# Patient Record
Sex: Female | Born: 1960 | Race: Black or African American | Hispanic: No | Marital: Married | Smoking: Never smoker
Health system: Southern US, Community
[De-identification: ages and names within clinical notes are randomized; demographics above are authoritative.]

## PROBLEM LIST (undated history)

## (undated) DIAGNOSIS — F32A Depression, unspecified: Secondary | ICD-10-CM

## (undated) DIAGNOSIS — F329 Major depressive disorder, single episode, unspecified: Secondary | ICD-10-CM

## (undated) DIAGNOSIS — E663 Overweight: Secondary | ICD-10-CM

## (undated) DIAGNOSIS — E079 Disorder of thyroid, unspecified: Secondary | ICD-10-CM

## (undated) DIAGNOSIS — I10 Essential (primary) hypertension: Secondary | ICD-10-CM

## (undated) DIAGNOSIS — R232 Flushing: Secondary | ICD-10-CM

## (undated) HISTORY — DX: Flushing: R23.2

## (undated) HISTORY — DX: Overweight: E66.3

---

## 2008-01-15 ENCOUNTER — Emergency Department (HOSPITAL_BASED_OUTPATIENT_CLINIC_OR_DEPARTMENT_OTHER): Admission: EM | Admit: 2008-01-15 | Discharge: 2008-01-15 | Payer: Self-pay | Admitting: Emergency Medicine

## 2010-03-18 ENCOUNTER — Emergency Department (HOSPITAL_BASED_OUTPATIENT_CLINIC_OR_DEPARTMENT_OTHER)
Admission: EM | Admit: 2010-03-18 | Discharge: 2010-03-18 | Payer: Self-pay | Source: Home / Self Care | Admitting: Emergency Medicine

## 2010-03-18 LAB — URINE MICROSCOPIC-ADD ON

## 2010-03-18 LAB — CBC
Hemoglobin: 11.9 g/dL — ABNORMAL LOW (ref 12.0–15.0)
MCHC: 34.1 g/dL (ref 30.0–36.0)
Platelets: 256 10*3/uL (ref 150–400)
RDW: 16.5 % — ABNORMAL HIGH (ref 11.5–15.5)

## 2010-03-18 LAB — DIFFERENTIAL
Basophils Absolute: 0 10*3/uL (ref 0.0–0.1)
Eosinophils Absolute: 0 10*3/uL (ref 0.0–0.7)
Lymphs Abs: 0.3 10*3/uL — ABNORMAL LOW (ref 0.7–4.0)
Monocytes Absolute: 0.3 10*3/uL (ref 0.1–1.0)
Monocytes Relative: 6 % (ref 3–12)

## 2010-03-18 LAB — URINALYSIS, ROUTINE W REFLEX MICROSCOPIC
Bilirubin Urine: NEGATIVE
Nitrite: NEGATIVE
Protein, ur: 30 mg/dL — AB
Specific Gravity, Urine: 1.033 — ABNORMAL HIGH (ref 1.005–1.030)
Urobilinogen, UA: 0.2 mg/dL (ref 0.0–1.0)

## 2010-03-18 LAB — COMPREHENSIVE METABOLIC PANEL
ALT: 19 U/L (ref 0–35)
AST: 28 U/L (ref 0–37)
Albumin: 4.4 g/dL (ref 3.5–5.2)
Calcium: 9.3 mg/dL (ref 8.4–10.5)
GFR calc Af Amer: >60 mL/min (ref >60)
Sodium: 142 mEq/L (ref 135–145)
Total Protein: 8.5 g/dL — ABNORMAL HIGH (ref 6.0–8.3)

## 2011-06-08 ENCOUNTER — Emergency Department (HOSPITAL_BASED_OUTPATIENT_CLINIC_OR_DEPARTMENT_OTHER)
Admission: EM | Admit: 2011-06-08 | Discharge: 2011-06-08 | Disposition: A | Discharge status: Home / Self Care | Payer: Managed Care, Other (non HMO) | Attending: Emergency Medicine | Admitting: Emergency Medicine

## 2011-06-08 ENCOUNTER — Emergency Department (INDEPENDENT_AMBULATORY_CARE_PROVIDER_SITE_OTHER): Payer: Managed Care, Other (non HMO)

## 2011-06-08 ENCOUNTER — Encounter (HOSPITAL_BASED_OUTPATIENT_CLINIC_OR_DEPARTMENT_OTHER): Payer: Self-pay | Admitting: *Deleted

## 2011-06-08 DIAGNOSIS — R52 Pain, unspecified: Secondary | ICD-10-CM

## 2011-06-08 DIAGNOSIS — R509 Fever, unspecified: Secondary | ICD-10-CM

## 2011-06-08 DIAGNOSIS — R0602 Shortness of breath: Secondary | ICD-10-CM | POA: Insufficient documentation

## 2011-06-08 DIAGNOSIS — B349 Viral infection, unspecified: Secondary | ICD-10-CM

## 2011-06-08 DIAGNOSIS — R059 Cough, unspecified: Secondary | ICD-10-CM

## 2011-06-08 DIAGNOSIS — R05 Cough: Secondary | ICD-10-CM | POA: Insufficient documentation

## 2011-06-08 DIAGNOSIS — I10 Essential (primary) hypertension: Secondary | ICD-10-CM | POA: Insufficient documentation

## 2011-06-08 DIAGNOSIS — J029 Acute pharyngitis, unspecified: Secondary | ICD-10-CM | POA: Insufficient documentation

## 2011-06-08 HISTORY — DX: Essential (primary) hypertension: I10

## 2011-06-08 IMAGING — CR DG CHEST 2V
2 series · 2 of 2 positions shown · non-contrast
Comparison: None.

CLINICAL DATA: Fever and cough.

CHEST - 2 VIEW

[w chest pa]
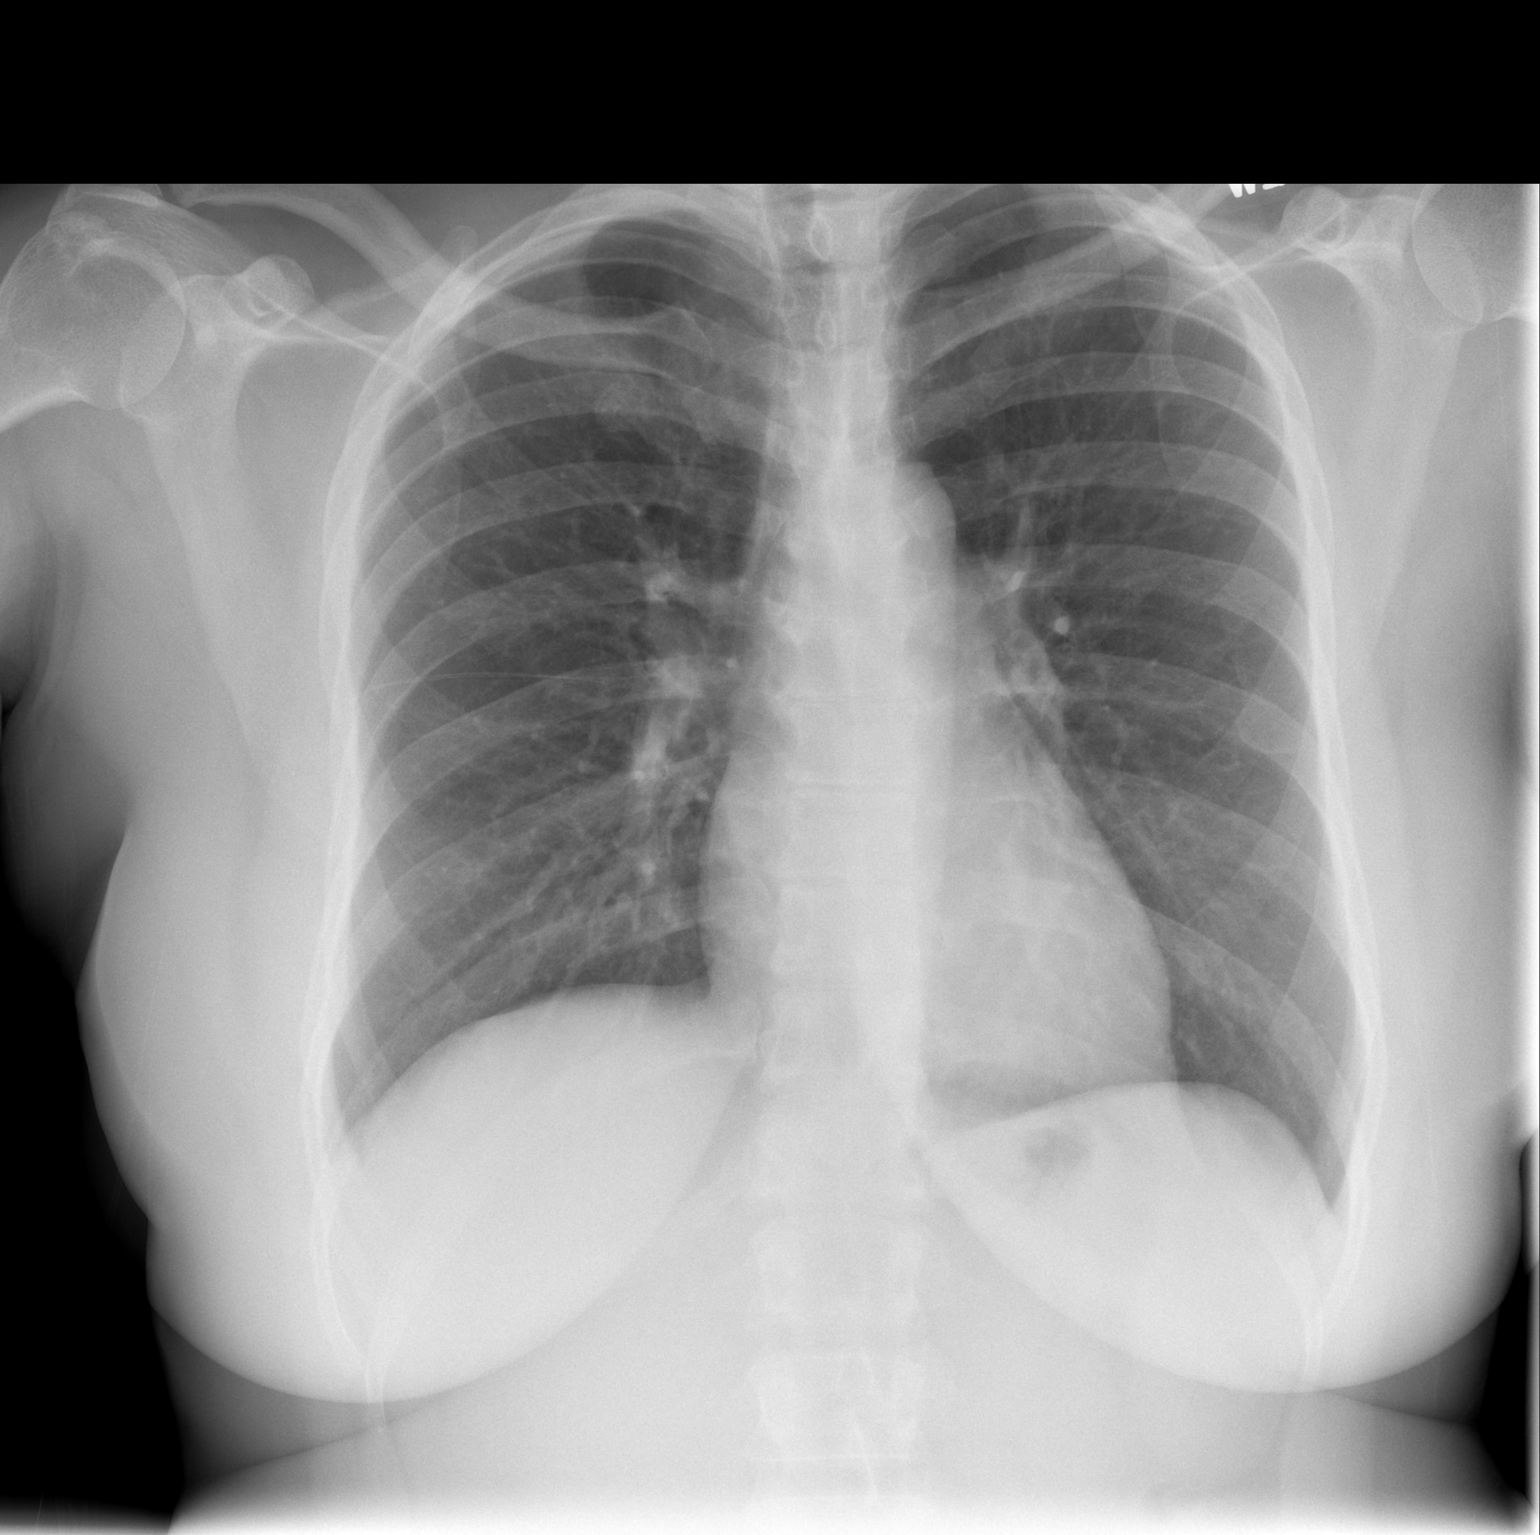

[w chest lat]
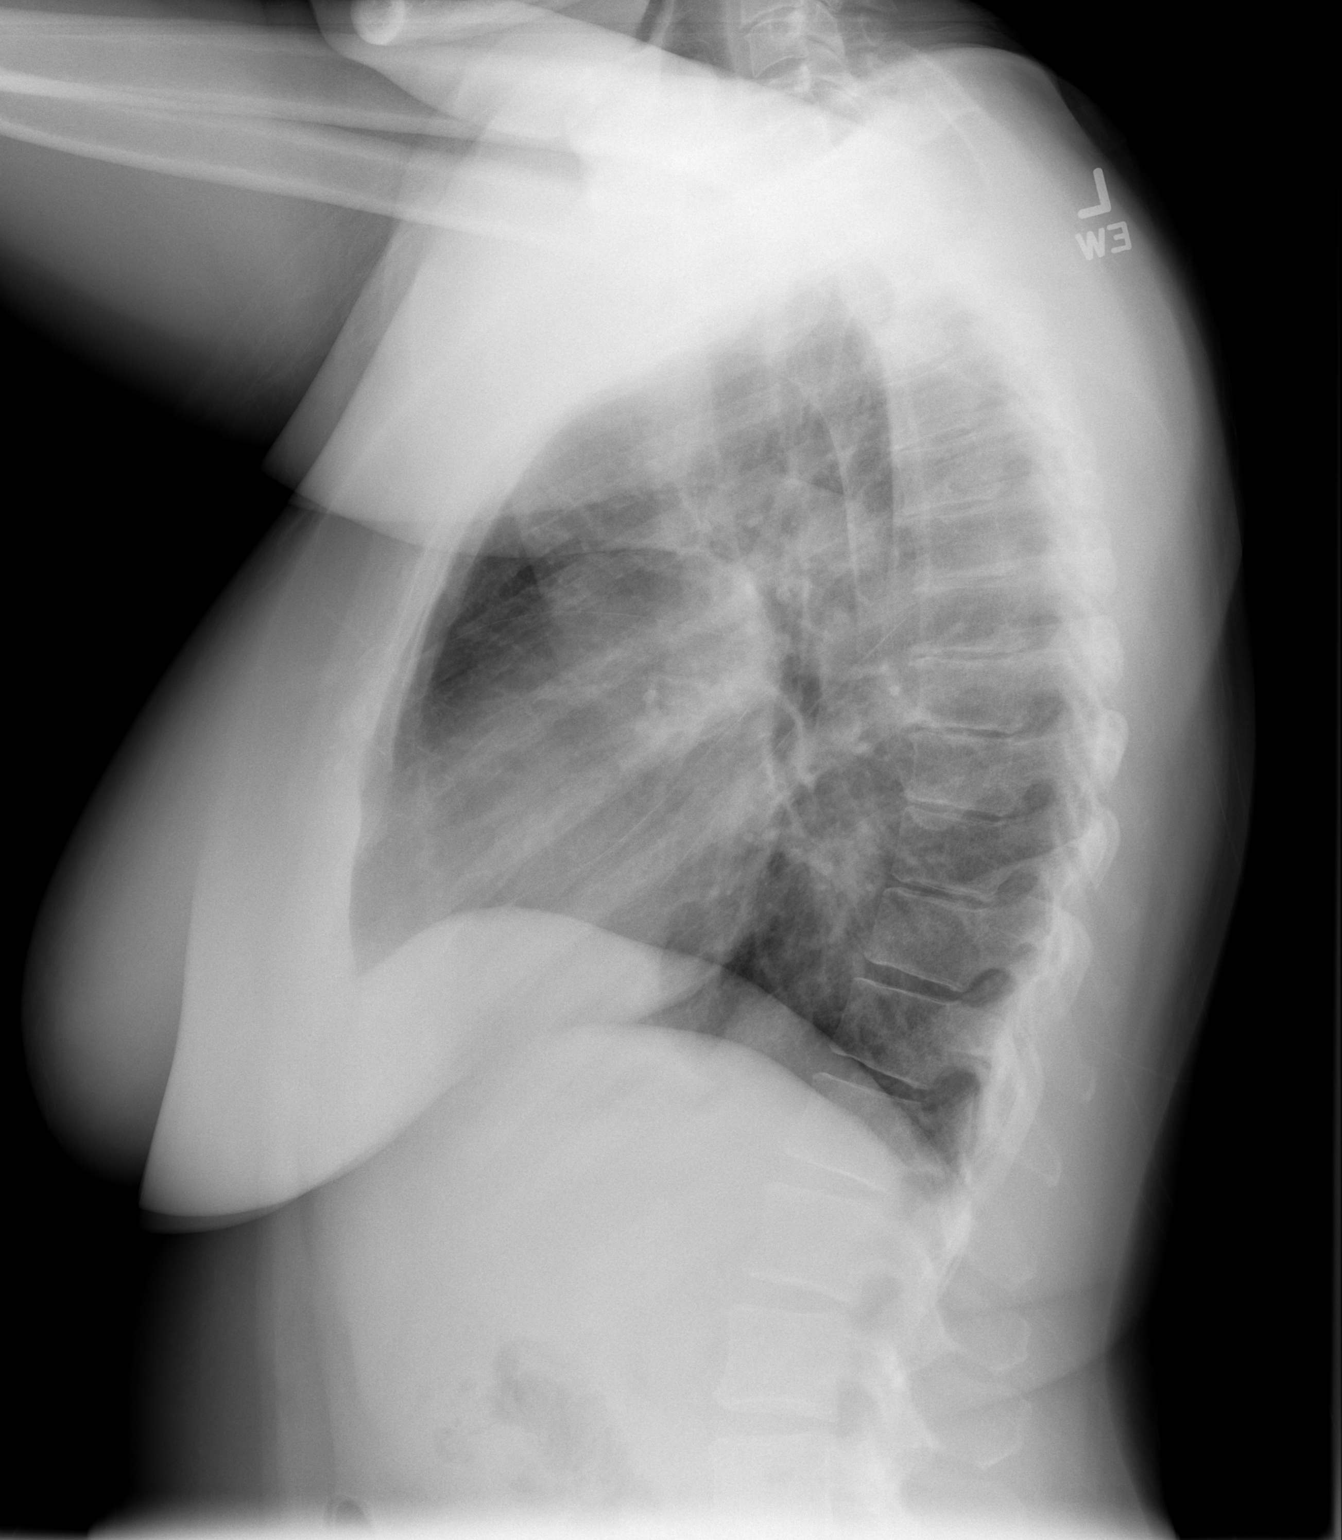

[2 of 2 positions shown; findings below may reference images not displayed]

FINDINGS: Trachea is midline.  Heart size normal.  Lungs are clear.
No pleural fluid.
IMPRESSION: No acute findings.

## 2011-06-08 MED ORDER — ALBUTEROL SULFATE (5 MG/ML) 0.5% IN NEBU
5.0000 mg | INHALATION_SOLUTION | Freq: Once | RESPIRATORY_TRACT | Status: AC
  Administered 2011-06-08: 5 mg via RESPIRATORY_TRACT
  Filled 2011-06-08: qty 1

## 2011-06-08 MED ORDER — IBUPROFEN 800 MG PO TABS
800.0000 mg | ORAL_TABLET | Freq: Once | ORAL | Status: AC
  Administered 2011-06-08: 800 mg via ORAL
  Filled 2011-06-08: qty 1

## 2011-06-08 MED ORDER — IPRATROPIUM BROMIDE 0.02 % IN SOLN
0.5000 mg | Freq: Once | RESPIRATORY_TRACT | Status: AC
  Administered 2011-06-08: 0.5 mg via RESPIRATORY_TRACT
  Filled 2011-06-08: qty 2.5

## 2011-06-08 NOTE — Discharge Instructions (Signed)
Antibiotic Nonuse  Your caregiver felt that the infection or problem was not one that would be helped with an antibiotic. Infections may be caused by viruses or bacteria. Only a caregiver can tell which one of these is the likely cause of an illness. A cold is the most common cause of infection in both adults and children. A cold is a virus. Antibiotic treatment will have no effect on a viral infection. Viruses can lead to many lost days of work caring for sick children and many missed days of school. Children may catch as many as 10 "colds" or "flus" per year during which they can be tearful, cranky, and uncomfortable. The goal of treating a virus is aimed at keeping the ill person comfortable. Antibiotics are medications used to help the body fight bacterial infections. There are relatively few types of bacteria that cause infections but there are hundreds of viruses. While both viruses and bacteria cause infection they are very different types of germs. A viral infection will typically go away by itself within 7 to 10 days. Bacterial infections may spread or get worse without antibiotic treatment. Examples of bacterial infections are:  Sore throats (like strep throat or tonsillitis).   Infection in the lung (pneumonia).   Ear and skin infections.  Examples of viral infections are:  Colds or flus.   Most coughs and bronchitis.   Sore throats not caused by Strep.   Runny noses.  It is often best not to take an antibiotic when a viral infection is the cause of the problem. Antibiotics can kill off the helpful bacteria that we have inside our body and allow harmful bacteria to start growing. Antibiotics can cause side effects such as allergies, nausea, and diarrhea without helping to improve the symptoms of the viral infection. Additionally, repeated uses of antibiotics can cause bacteria inside of our body to become resistant. That resistance can be passed onto harmful bacterial. The next time  you have an infection it may be harder to treat if antibiotics are used when they are not needed. Not treating with antibiotics allows our own immune system to develop and take care of infections more efficiently. Also, antibiotics will work better for us when they are prescribed for bacterial infections. Treatments for a child that is ill may include:  Give extra fluids throughout the day to stay hydrated.   Get plenty of rest.   Only give your child over-the-counter or prescription medicines for pain, discomfort, or fever as directed by your caregiver.   The use of a cool mist humidifier may help stuffy noses.   Cold medications if suggested by your caregiver.  Your caregiver may decide to start you on an antibiotic if:  The problem you were seen for today continues for a longer length of time than expected.   You develop a secondary bacterial infection.  SEEK MEDICAL CARE IF:  Fever lasts longer than 5 days.   Symptoms continue to get worse after 5 to 7 days or become severe.   Difficulty in breathing develops.   Signs of dehydration develop (poor drinking, rare urinating, dark colored urine).   Changes in behavior or worsening tiredness (listlessness or lethargy).  Document Released: 04/13/2001 Document Revised: 01/22/2011 Document Reviewed: 10/10/2008 Exit[employer] Patient Information 2012 [employer]. 

## 2011-06-08 NOTE — ED Provider Notes (Signed)
BP 134/72  Pulse 88  Temp(Src) 98.7 F (37.1 C) (Oral)  Resp 20  SpO2 100% I received patient in signout to f/u on CXR CXR reviewed and is negative Pt resting comfortably, no distress, lung sounds clear on my exam She reports cough/sore throat/myalgias Nontoxic in appearance, stable for d/c  Joya Gaskins, MD 06/08/11 [phone]

## 2011-06-08 NOTE — ED Notes (Signed)
Pt states that she began having a sore throat on Sat and last night while at work she developed generalized body aches and fever pt has had no meds PTA

## 2011-06-08 NOTE — ED Provider Notes (Signed)
History     CSN: [id number]  Arrival date & time 06/08/11  4098   First MD Initiated Contact with Patient 06/08/11 [phone]      Chief Complaint  Patient presents with  . Fever  . Generalized Body Aches    (Consider location/radiation/quality/duration/timing/severity/associated sxs/prior treatment) HPI This is a 51 year old black female who began having a sore throat 2 days ago. Yesterday she developed a fever to 103. She subsequently developed clear rhinorrhea, fever to 103, and generalized body aches, cough, wheezing and shortness of breath. She's not taking any medications for this. She describes the symptoms as moderate to severe. She has also had chills.  Past Medical History  Diagnosis Date  . Hypertension     Past Surgical History  Procedure Date  . Abdominal hysterectomy     History reviewed. No pertinent family history.  History  Substance Use Topics  . Smoking status: Never Smoker   . Smokeless tobacco: Not on file  . Alcohol Use: No    OB History    Grav Para Term Preterm Abortions TAB SAB Ect Mult Living                  Review of Systems  All other systems reviewed and are negative.    Allergies  Review of patient's allergies indicates no known allergies.  Home Medications   Current Outpatient Rx  Name Route Sig Dispense Refill  . BENICAR PO Oral Take by mouth.      BP 134/72  Pulse 88  Temp(Src) 98.7 F (37.1 C) (Oral)  Resp 20  SpO2 100%  Physical Exam General: Well-developed, well-nourished female in no acute distress; appearance consistent with age of record HENT: normocephalic, atraumatic; normal-appearing TMs; slight tonsillar exudate; nasal congestion Eyes: pupils equal round and reactive to light; extraocular muscles intact Neck: supple Heart: Regular rate and rhythm Lungs: Decreased air movement bilaterally without frank wheezing; dry cough Abdomen: soft; nondistended; nontender Extremities: No deformity; full range of  motion Neurologic: Awake, alert and oriented; motor function intact in all extremities and symmetric; no facial droop Skin: Warm and dry     ED Course  Procedures (including critical care time)    MDM  Disposition per Dr. Bebe Shaggy.         Hanley Seamen, MD 06/08/11 [phone]

## 2011-07-18 HISTORY — PX: ABDOMINAL HYSTERECTOMY: SHX81

## 2012-07-07 LAB — HM COLONOSCOPY

## 2014-02-16 HISTORY — PX: COLONOSCOPY: SHX174

## 2015-04-24 ENCOUNTER — Ambulatory Visit (INDEPENDENT_AMBULATORY_CARE_PROVIDER_SITE_OTHER): Payer: BLUE CROSS/BLUE SHIELD | Admitting: Internal Medicine

## 2015-04-24 ENCOUNTER — Encounter: Payer: Self-pay | Admitting: Internal Medicine

## 2015-04-24 VITALS — BP 130/100 | HR 65 | Temp 97.7°F | Resp 18 | Ht 65.0 in | Wt 195.6 lb

## 2015-04-24 DIAGNOSIS — I10 Essential (primary) hypertension: Secondary | ICD-10-CM

## 2015-04-24 DIAGNOSIS — N951 Menopausal and female climacteric states: Secondary | ICD-10-CM | POA: Diagnosis not present

## 2015-04-24 DIAGNOSIS — R1013 Epigastric pain: Secondary | ICD-10-CM | POA: Diagnosis not present

## 2015-04-24 DIAGNOSIS — G4700 Insomnia, unspecified: Secondary | ICD-10-CM

## 2015-04-24 DIAGNOSIS — R232 Flushing: Secondary | ICD-10-CM

## 2015-04-24 MED ORDER — PAROXETINE HCL 10 MG PO TABS: 10.0000 mg | ORAL_TABLET | Freq: Every day | ORAL | Status: DC

## 2015-04-24 MED ORDER — VALSARTAN-HYDROCHLOROTHIAZIDE 80-12.5 MG PO TABS: 1.0000 | ORAL_TABLET | Freq: Every day | ORAL | Status: DC

## 2015-04-24 NOTE — Progress Notes (Signed)
Patient ID: Anaissa Macfadden, female   DOB: [birth year], 55 y.o.   MRN: [id number]    Location:    [facility]   Place of Service:  OFFICE    Advanced Directive information Does patient have an advance directive?: Yes, Type of Advance Directive: Living will, Does patient want to make changes to advanced directive?: No - Patient declined  Chief Complaint  Patient presents with  . Establish Care    New Patient Establish Care  . Medical Management of Chronic Issues    HPI:  55 yo female seen today as a new pt. She is c/a hot flashes that began after brand benicar hct changed to generic. She has frequent night sweats. She is taking trazodone without relief. Sleep interrupted and gets no more than 2hrs a sleep per night. She has tremors. No relief with benadryl. She reports increased mood swings. She has hx hysterectomy but ovaries intact.  HTN - she stopped benicar hct. She rec'd generic form and believes this is what caused hot flashes. 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 She is taking benadryl  She had fasting labs drawn in Dec 2016 at [facility] - CBC and CMP showed no significant abnormalities  Past Medical History  Diagnosis Date  . Hypertension   . Hot flashes   . Overweight     Past Surgical History  Procedure Laterality Date  . Abdominal hysterectomy  07/18/2011    Dr.Mattern   . Colonoscopy  2016    Patient Reported     Patient Care Team: Gildardo Cranker, DO as PCP - General (Internal Medicine)  Social History   Social History  . Marital Status: Married    Spouse Name: N/A  . Number of Children: N/A  . Years of Education: N/A   Occupational History  . Not on file.   Social History Main Topics  . Smoking status: Former Smoker -- 4.00 packs/day for 2 years    Types: Cigarettes  . Smokeless tobacco: Never Used  . Alcohol Use: No  . Drug Use: No  . Sexual Activity: Not on file   Other Topics Concern   . Not on file   Social History Narrative   Diet: None   Caffeine: Yes   Married: Yes, 2001   House: 2 stories, 2 persons   Pets: No   Current/Past profession: None listed    Exercise: Yes   Living Will: Yes   DNR: N/A   POA/HPOA: N/A           reports that she has quit smoking. Her smoking use included Cigarettes. She has a 8 pack-year smoking history. She has never used smokeless tobacco. She reports that she does not drink alcohol or use illicit drugs.  Family History  Problem Relation Age of Onset  . High blood pressure Father   . Heart disease Mother   . Diabetes Sister   . Obesity Sister   . Diabetes Brother   . Arthritis Brother   . High blood pressure Sister   . High blood pressure Sister   . High blood pressure Brother    Family Status  Relation Status Death Age  . Father Alive   . Mother Deceased 3  . Sister Alive   . Sister Alive   . Brother Alive   . Sister Alive   . Sister Alive   . Brother Alive   . Daughter Alive   .  Daughter Alive      There is no immunization history on file for this patient.  No Known Allergies  Medications: Patient's Medications  New Prescriptions   No medications on file  Previous Medications   DIPHENHYDRAMINE (BENADRYL) 25 MG CAPSULE    Take 25 mg by mouth daily.   FAMOTIDINE (PEPCID) 20 MG TABLET    Take 20 mg by mouth daily. PRN   FLUTICASONE-SALMETEROL (ADVAIR DISKUS) 250-50 MCG/DOSE AEPB    Inhale 2 puffs into the lungs as needed.   HYDROCODONE-HOMATROPINE (HYCODAN) 5-1.5 MG/5ML SYRUP    Take 5 mLs by mouth every 6 (six) hours as needed for cough.   NAFTIFINE HCL 2 % CREA    Apply topically. Apply twice a day   OLMESARTAN-HYDROCHLOROTHIAZIDE (BENICAR HCT) 20-12.5 MG TABLET    Take 1 tablet by mouth daily.   TRAZODONE (DESYREL) 50 MG TABLET    Take 100 mg by mouth at bedtime.  Modified Medications   No medications on file  Discontinued Medications   No medications on file    Review of Systems    Constitutional: Positive for fatigue.  Respiratory: Positive for cough.   Endocrine:       Hot flashes  Neurological: Positive for tremors.  Psychiatric/Behavioral: Positive for sleep disturbance and dysphoric mood.  All other systems reviewed and are negative.   Filed Vitals:   04/24/15 0837  BP: 130/100  Pulse: 65  Temp: 97.7 F (36.5 C)  TempSrc: Oral  Resp: 18  Height: 5' 5"  (1.651 m)  Weight: 195 lb 9.6 oz (88.724 kg)  SpO2: 98%   Body mass index is 32.55 kg/(m^2).  Physical Exam  Constitutional: She is oriented to person, place, and time. She appears well-developed and well-nourished.  HENT:  Mouth/Throat: Oropharynx is clear and moist. No oropharyngeal exudate.  Eyes: Pupils are equal, round, and reactive to light. No scleral icterus.  Neck: Neck supple. Carotid bruit is not present. No tracheal deviation present. No thyromegaly present.  Cardiovascular: Normal rate, regular rhythm, normal heart sounds and intact distal pulses.  Exam reveals no gallop and no friction rub.   No murmur heard. No LE edema b/l. no calf TTP.   Pulmonary/Chest: Effort normal and breath sounds normal. No stridor. No respiratory distress. She has no wheezes. She has no rales.  Abdominal: Soft. Bowel sounds are normal. She exhibits no distension and no mass. There is no hepatomegaly. There is no tenderness. There is no rebound and no guarding.  Lymphadenopathy:    She has no cervical adenopathy.  Neurological: She is alert and oriented to person, place, and time. She displays tremor (resting).  Skin: Skin is warm and dry. No rash noted.  Psychiatric: Judgment and thought content normal. She is aggressive. She exhibits a depressed mood.     Labs reviewed: No visits with results within 3 Month(s) from this visit. Latest known visit with results is:  Hospital Outpatient Visit on 03/18/2010  Component Date Value Ref Range Status  . Neutrophils Relative % 03/18/2010 88* 43 - 77 % Final  .  Lymphocytes Relative 03/18/2010 6* 12 - 46 % Final  . Monocytes Relative 03/18/2010 6  3 - 12 % Final  . Eosinophils Relative 03/18/2010 0  0 - 5 % Final  . Basophils Relative 03/18/2010 0  0 - 1 % Final  . Neutro Abs 03/18/2010 4.9  1.7 - 7.7 K/uL Final  . Lymphs Abs 03/18/2010 0.3* 0.7 - 4.0 K/uL Final  . Monocytes Absolute 03/18/2010  0.3  0.1 - 1.0 K/uL Final  . Eosinophils Absolute 03/18/2010 0.0  0.0 - 0.7 K/uL Final  . Basophils Absolute 03/18/2010 0.0  0.0 - 0.1 K/uL Final  . Smear Review 03/18/2010 MORPHOLOGY UNREMARKABLE   Final  . WBC 03/18/2010 5.5  4.0 - 10.5 K/uL Final  . RBC 03/18/2010 5.08  3.87 - 5.11 MIL/uL Final  . Hemoglobin 03/18/2010 11.9* 12.0 - 15.0 g/dL Final  . HCT 03/18/2010 34.9* 36.0 - 46.0 % Final  . MCV 03/18/2010 68.7* 78.0 - 100.0 fL Final  . MCH 03/18/2010 23.4* 26.0 - 34.0 pg Final  . MCHC 03/18/2010 34.1  30.0 - 36.0 g/dL Final  . RDW 03/18/2010 16.5* 11.5 - 15.5 % Final  . Platelets 03/18/2010 256  150 - 400 K/uL Final  . Sodium 03/18/2010 142  135 - 145 mEq/L Final  . Potassium 03/18/2010 4.4  3.5 - 5.1 mEq/L Final  . Chloride 03/18/2010 106  96 - 112 mEq/L Final  . CO2 03/18/2010 23  19 - 32 mEq/L Final  . Glucose, Bld 03/18/2010 144* 70 - 99 mg/dL Final  . BUN 03/18/2010 16  6 - 23 mg/dL Final  . Creatinine, Ser 03/18/2010 .8  0.4 - 1.2 mg/dL Final  . Calcium 03/18/2010 9.3  8.4 - 10.5 mg/dL Final  . Total Protein 03/18/2010 8.5* 6.0 - 8.3 g/dL Final  . Albumin 03/18/2010 4.4  3.5 - 5.2 g/dL Final  . AST 03/18/2010 28  0 - 37 U/L Final  . ALT 03/18/2010 19  0 - 35 U/L Final  . Alkaline Phosphatase 03/18/2010 89  39 - 117 U/L Final  . Total Bilirubin 03/18/2010 0.8  0.3 - 1.2 mg/dL Final  . GFR calc non Af Amer 03/18/2010 >60  >60 mL/min Final  . GFR calc Af Amer 03/18/2010   >60 mL/min Final                   Value:>60                                The eGFR has been calculated                         using the MDRD equation.                          This calculation has not been                         validated in all clinical                         situations.                         eGFR's persistently                         <60 mL/min signify                         possible Chronic Kidney Disease.  Marland Kitchen Squamous Epithelial / LPF 03/18/2010 RARE  RARE Final  . WBC, UA 03/18/2010 0-2  <3 WBC/hpf Final  . RBC / HPF 03/18/2010 0-2  <3 RBC/hpf Final  . Bacteria, UA  03/18/2010 FEW* RARE Final  . Urine-Other 03/18/2010 MUCOUS PRESENT   Final  . Color, Urine 03/18/2010 YELLOW  YELLOW Final  . APPearance 03/18/2010 CLEAR  CLEAR Final  . Specific Gravity, Urine 03/18/2010 1.033* 1.005 - 1.030 Final  . pH 03/18/2010 5.5  5.0 - 8.0 Final  . Urine Glucose, Fasting 03/18/2010 NEGATIVE  NEGATIVE mg/dL Final  . Hgb urine dipstick 03/18/2010 TRACE* NEGATIVE Final  . Bilirubin Urine 03/18/2010 NEGATIVE  NEGATIVE Final  . Ketones, ur 03/18/2010 NEGATIVE  NEGATIVE mg/dL Final  . Protein, ur 03/18/2010 30* NEGATIVE mg/dL Final  . Urobilinogen, UA 03/18/2010 0.2  0.0 - 1.0 mg/dL Final  . Nitrite 03/18/2010 NEGATIVE  NEGATIVE Final  . Leukocytes, UA 03/18/2010 NEGATIVE  NEGATIVE Final    No results found.   Assessment/Plan   ICD-9-CM ICD-10-CM   1. Essential hypertension - uncontrolled off med 401.9 I10 valsartan-hydrochlorothiazide (DIOVAN HCT) 80-12.5 MG tablet  2. Hot flashes 627.2 N95.1 PARoxetine (PAXIL) 10 MG tablet  3. Insomnia - due to #2 780.52 G47.00 PARoxetine (PAXIL) 10 MG tablet  4. Dyspepsia - stable 536.8 R10.13     Start diovan hct- take 1 daily  Start paxil for vasomotor sx's- may take at night  Get old records  Follow up in 1 month for hotflashes and blood pressure  Heavan Francom S. Perlie Gold  [facility] [address] [place], [place] [address] [phone] Cell (Monday-Friday 8 AM - 5 PM) [phone] After 5 PM and follow prompts

## 2015-04-24 NOTE — Patient Instructions (Signed)
Start blood pressure medication - take 1 daily  Start hot flash medication - may take at night  Get old records  Follow up in 1 month for hotflashes and blood preesue

## 2015-05-22 ENCOUNTER — Ambulatory Visit (INDEPENDENT_AMBULATORY_CARE_PROVIDER_SITE_OTHER): Payer: BLUE CROSS/BLUE SHIELD | Admitting: Internal Medicine

## 2015-05-22 ENCOUNTER — Encounter: Payer: Self-pay | Admitting: Internal Medicine

## 2015-05-22 VITALS — BP 120/84 | HR 73 | Temp 98.0°F | Resp 18 | Ht 65.0 in | Wt 197.8 lb

## 2015-05-22 DIAGNOSIS — N951 Menopausal and female climacteric states: Secondary | ICD-10-CM | POA: Diagnosis not present

## 2015-05-22 DIAGNOSIS — I10 Essential (primary) hypertension: Secondary | ICD-10-CM

## 2015-05-22 DIAGNOSIS — E669 Obesity, unspecified: Secondary | ICD-10-CM

## 2015-05-22 DIAGNOSIS — R232 Flushing: Secondary | ICD-10-CM

## 2015-05-22 DIAGNOSIS — G4700 Insomnia, unspecified: Secondary | ICD-10-CM | POA: Diagnosis not present

## 2015-05-22 MED ORDER — PHENTERMINE HCL 30 MG PO CAPS: 30.0000 mg | ORAL_CAPSULE | ORAL | Status: DC

## 2015-05-22 NOTE — Patient Instructions (Addendum)
Resume trazodone. Take 1 tablet at bedtime. If no response, may take 2 tablets.  Continue all other medications as ordered  Follow up in 6 weeks for routine visit  Calorie Counting for Weight Loss Calories are energy you get from the things you eat and drink. Your body uses this energy to keep you going throughout the day. The number of calories you eat affects your weight. When you eat more calories than your body needs, your body stores the extra calories as fat. When you eat fewer calories than your body needs, your body burns fat to get the energy it needs. Calorie counting means keeping track of how many calories you eat and drink each day. If you make sure to eat fewer calories than your body needs, you should lose weight. In order for calorie counting to work, you will need to eat the number of calories that are right for you in a day to lose a healthy amount of weight per week. A healthy amount of weight to lose per week is usually 1-2 lb (0.5-0.9 kg). A dietitian can determine how many calories you need in a day and give you suggestions on how to reach your calorie goal.  WHAT IS MY MY PLAN? My goal is to have 1200 calories per day.  If I have this many calories per day, I should lose around 2 pounds per week. WHAT DO I NEED TO KNOW ABOUT CALORIE COUNTING? In order to meet your daily calorie goal, you will need to:  Find out how many calories are in each food you would like to eat. Try to do this before you eat.  Decide how much of the food you can eat.  Write down what you ate and how many calories it had. Doing this is called keeping a food log. WHERE DO I FIND CALORIE INFORMATION? The number of calories in a food can be found on a Nutrition Facts label. Note that all the information on a label is based on a specific serving of the food. If a food does not have a Nutrition Facts label, try to look up the calories online or ask your dietitian for help. HOW DO I DECIDE HOW MUCH TO  EAT? To decide how much of the food you can eat, you will need to consider both the number of calories in one serving and the size of one serving. This information can be found on the Nutrition Facts label. If a food does not have a Nutrition Facts label, look up the information online or ask your dietitian for help. Remember that calories are listed per serving. If you choose to have more than one serving of a food, you will have to multiply the calories per serving by the amount of servings you plan to eat. For example, the label on a package of bread might say that a serving size is 1 slice and that there are 90 calories in a serving. If you eat 1 slice, you will have eaten 90 calories. If you eat 2 slices, you will have eaten 180 calories. HOW DO I KEEP A FOOD LOG? After each meal, record the following information in your food log:  What you ate.  How much of it you ate.  How many calories it had.  Then, add up your calories. Keep your food log near you, such as in a small notebook in your pocket. Another option is to use a mobile app or website. Some programs will calculate calories for  you and show you how many calories you have left each time you add an item to the log. WHAT ARE SOME CALORIE COUNTING TIPS?  Use your calories on foods and drinks that will fill you up and not leave you hungry. Some examples of this include foods like nuts and nut butters, vegetables, lean proteins, and high-fiber foods (more than 5 g fiber per serving).  Eat nutritious foods and avoid empty calories. Empty calories are calories you get from foods or beverages that do not have many nutrients, such as candy and soda. It is better to have a nutritious high-calorie food (such as an avocado) than a food with few nutrients (such as a bag of chips).  Know how many calories are in the foods you eat most often. This way, you do not have to look up how many calories they have each time you eat them.  Look out for  foods that may seem like low-calorie foods but are really high-calorie foods, such as baked goods, soda, and fat-free candy.  Pay attention to calories in drinks. Drinks such as sodas, specialty coffee drinks, alcohol, and juices have a lot of calories yet do not fill you up. Choose low-calorie drinks like water and diet drinks.  Focus your calorie counting efforts on higher calorie items. Logging the calories in a garden salad that contains only vegetables is less important than calculating the calories in a milk shake.  Find a way of tracking calories that works for you. Get creative. Most people who are successful find ways to keep track of how much they eat in a day, even if they do not count every calorie. WHAT ARE SOME PORTION CONTROL TIPS?  Know how many calories are in a serving. This will help you know how many servings of a certain food you can have.  Use a measuring cup to measure serving sizes. This is helpful when you start out. With time, you will be able to estimate serving sizes for some foods.  Take some time to put servings of different foods on your favorite plates, bowls, and cups so you know what a serving looks like.  Try not to eat straight from a bag or box. Doing this can lead to overeating. Put the amount you would like to eat in a cup or on a plate to make sure you are eating the right portion.  Use smaller plates, glasses, and bowls to prevent overeating. This is a quick and easy way to practice portion control. If your plate is smaller, less food can fit on it.  Try not to multitask while eating, such as watching TV or using your computer. If it is time to eat, sit down at a table and enjoy your food. Doing this will help you to start recognizing when you are full. It will also make you more aware of what and how much you are eating. HOW CAN I CALORIE COUNT WHEN EATING OUT?  Ask for smaller portion sizes or child-sized portions.  Consider sharing an entree and  sides instead of getting your own entree.  If you get your own entree, eat only half. Ask for a box at the beginning of your meal and put the rest of your entree in it so you are not tempted to eat it.  Look for the calories on the menu. If calories are listed, choose the lower calorie options.  Choose dishes that include vegetables, fruits, whole grains, low-fat dairy products, and lean protein. Focusing  on smart food choices from each of the 5 food groups can help you stay on track at restaurants.  Choose items that are boiled, broiled, grilled, or steamed.  Choose water, milk, unsweetened iced tea, or other drinks without added sugars. If you want an alcoholic beverage, choose a lower calorie option. For example, a regular margarita can have up to 700 calories and a glass of wine has around 150.  Stay away from items that are buttered, battered, fried, or served with cream sauce. Items labeled "crispy" are usually fried, unless stated otherwise.  Ask for dressings, sauces, and syrups on the side. These are usually very high in calories, so do not eat much of them.  Watch out for salads. Many people think salads are a healthy option, but this is often not the case. Many salads come with bacon, fried chicken, lots of cheese, fried chips, and dressing. All of these items have a lot of calories. If you want a salad, choose a garden salad and ask for grilled meats or steak. Ask for the dressing on the side, or ask for olive oil and vinegar or lemon to use as dressing.  Estimate how many servings of a food you are given. For example, a serving of cooked rice is  cup or about the size of half a tennis ball or one cupcake wrapper. Knowing serving sizes will help you be aware of how much food you are eating at restaurants. The list below tells you how big or small some common portion sizes are based on everyday objects.  1 oz--4 stacked dice.  3 oz--1 deck of cards.  1 tsp--1 dice.  1 Tbsp-- a  Ping-Pong ball.  2 Tbsp--1 Ping-Pong ball.   cup--1 tennis ball or 1 cupcake wrapper.  1 cup--1 baseball.   This information is not intended to replace advice given to you by your health care provider. Make sure you discuss any questions you have with your health care provider.   Document Released: 02/02/2005 Document Revised: 02/23/2014 Document Reviewed: 12/08/2012 Elsevier Interactive Patient Education [employer].

## 2015-05-22 NOTE — Progress Notes (Signed)
Patient ID: Angela Frost, female   DOB: [birth year], 55 y.o.   MRN: [id number]    Location:    [facility]   Place of Service:  OFFICE   Chief Complaint  Patient presents with  . Medical Management of Chronic Issues    1 month follow-up for routine visit    HPI:  55 yo female seen today for f/u. She feels better overall but is still not sleeping through the night. No issues with falling asleep but has difficulty staying asleep. She has not been taking trazodone as she was unaware that she could take it in combo with paroxetine  HTN - BP improved on diovan hct. Some urinary frequency and has nocturia (2x per night)  Vasomotor sx's/insomnia - hot flashes and night sweats improved on paxil.   hyperlipidemia - pt prefers to watch diet. Total cholesterol 236, HDL 59 and LDL 165 in July 2016  Past Medical History  Diagnosis Date  . Hypertension   . Hot flashes   . Overweight     Past Surgical History  Procedure Laterality Date  . Abdominal hysterectomy  07/18/2011    Dr.Mattern   . Colonoscopy  2016    Patient Reported     Patient Care Team: Gildardo Cranker, DO as PCP - General (Internal Medicine)  Social History   Social History  . Marital Status: Married    Spouse Name: N/A  . Number of Children: N/A  . Years of Education: N/A   Occupational History  . Not on file.   Social History Main Topics  . Smoking status: Former Smoker -- 4.00 packs/day for 2 years    Types: Cigarettes  . Smokeless tobacco: Never Used  . Alcohol Use: No  . Drug Use: No  . Sexual Activity: Not on file   Other Topics Concern  . Not on file   Social History Narrative   Diet: None   Caffeine: Yes   Married: Yes, 2001   House: 2 stories, 2 persons   Pets: No   Current/Past profession: None listed    Exercise: Yes   Living Will: Yes   DNR: N/A   POA/HPOA: N/A           reports that she has quit smoking. Her smoking use included Cigarettes. She has a 8 pack-year smoking history. She has  never used smokeless tobacco. She reports that she does not drink alcohol or use illicit drugs.  Allergies  Allergen Reactions  . Estradiol Itching    Medications: Patient's Medications  New Prescriptions   No medications on file  Previous Medications   DIPHENHYDRAMINE (BENADRYL) 25 MG CAPSULE    Take 25 mg by mouth daily.   FAMOTIDINE (PEPCID) 20 MG TABLET    Take 20 mg by mouth daily. PRN   FLUTICASONE-SALMETEROL (ADVAIR DISKUS) 250-50 MCG/DOSE AEPB    Inhale 2 puffs into the lungs as needed.   NAFTIFINE HCL 2 % CREA    Apply topically. Apply twice a day   PAROXETINE (PAXIL) 10 MG TABLET    Take 1 tablet (10 mg total) by mouth daily.   TRAZODONE (DESYREL) 50 MG TABLET    Take 100 mg by mouth at bedtime.   VALSARTAN-HYDROCHLOROTHIAZIDE (DIOVAN HCT) 80-12.5 MG TABLET    Take 1 tablet by mouth daily.  Modified Medications   No medications on file  Discontinued Medications   HYDROCODONE-HOMATROPINE (HYCODAN) 5-1.5 MG/5ML SYRUP    Take 5 mLs by mouth every 6 (six) hours as needed for  cough.   OLMESARTAN-HYDROCHLOROTHIAZIDE (BENICAR HCT) 20-12.5 MG TABLET    Take 1 tablet by mouth daily.    Review of Systems  Genitourinary: Positive for frequency.  Psychiatric/Behavioral: Positive for sleep disturbance.  All other systems reviewed and are negative.   Filed Vitals:   05/22/15 1433  BP: 120/84  Pulse: 73  Temp: 98 F (36.7 C)  TempSrc: Oral  Resp: 18  Height: 5' 5"  (1.651 m)  Weight: 197 lb 12.8 oz (89.721 kg)  SpO2: 98%   Body mass index is 32.92 kg/(m^2).  Physical Exam  Constitutional: She is oriented to person, place, and time. She appears well-developed and well-nourished.  Cardiovascular: Normal rate, regular rhythm, normal heart sounds and intact distal pulses.  Exam reveals no gallop and no friction rub.   No murmur heard. Pulmonary/Chest: Effort normal and breath sounds normal. No respiratory distress. She has no wheezes. She has no rales. She exhibits no  tenderness.  Neurological: She is alert and oriented to person, place, and time.  Skin: Skin is warm and dry. No rash noted.  Psychiatric: She has a normal mood and affect. Her behavior is normal. Judgment and thought content normal.     Labs reviewed: No visits with results within 3 Month(s) from this visit. Latest known visit with results is:  Hospital Outpatient Visit on 03/18/2010  Component Date Value Ref Range Status  . Neutrophils Relative % 03/18/2010 88* 43 - 77 % Final  . Lymphocytes Relative 03/18/2010 6* 12 - 46 % Final  . Monocytes Relative 03/18/2010 6  3 - 12 % Final  . Eosinophils Relative 03/18/2010 0  0 - 5 % Final  . Basophils Relative 03/18/2010 0  0 - 1 % Final  . Neutro Abs 03/18/2010 4.9  1.7 - 7.7 K/uL Final  . Lymphs Abs 03/18/2010 0.3* 0.7 - 4.0 K/uL Final  . Monocytes Absolute 03/18/2010 0.3  0.1 - 1.0 K/uL Final  . Eosinophils Absolute 03/18/2010 0.0  0.0 - 0.7 K/uL Final  . Basophils Absolute 03/18/2010 0.0  0.0 - 0.1 K/uL Final  . Smear Review 03/18/2010 MORPHOLOGY UNREMARKABLE   Final  . WBC 03/18/2010 5.5  4.0 - 10.5 K/uL Final  . RBC 03/18/2010 5.08  3.87 - 5.11 MIL/uL Final  . Hemoglobin 03/18/2010 11.9* 12.0 - 15.0 g/dL Final  . HCT 03/18/2010 34.9* 36.0 - 46.0 % Final  . MCV 03/18/2010 68.7* 78.0 - 100.0 fL Final  . MCH 03/18/2010 23.4* 26.0 - 34.0 pg Final  . MCHC 03/18/2010 34.1  30.0 - 36.0 g/dL Final  . RDW 03/18/2010 16.5* 11.5 - 15.5 % Final  . Platelets 03/18/2010 256  150 - 400 K/uL Final  . Sodium 03/18/2010 142  135 - 145 mEq/L Final  . Potassium 03/18/2010 4.4  3.5 - 5.1 mEq/L Final  . Chloride 03/18/2010 106  96 - 112 mEq/L Final  . CO2 03/18/2010 23  19 - 32 mEq/L Final  . Glucose, Bld 03/18/2010 144* 70 - 99 mg/dL Final  . BUN 03/18/2010 16  6 - 23 mg/dL Final  . Creatinine, Ser 03/18/2010 .8  0.4 - 1.2 mg/dL Final  . Calcium 03/18/2010 9.3  8.4 - 10.5 mg/dL Final  . Total Protein 03/18/2010 8.5* 6.0 - 8.3 g/dL Final  .  Albumin 03/18/2010 4.4  3.5 - 5.2 g/dL Final  . AST 03/18/2010 28  0 - 37 U/L Final  . ALT 03/18/2010 19  0 - 35 U/L Final  . Alkaline Phosphatase 03/18/2010 89  39 - 117 U/L Final  . Total Bilirubin 03/18/2010 0.8  0.3 - 1.2 mg/dL Final  . GFR calc non Af Amer 03/18/2010 >60  >60 mL/min Final  . GFR calc Af Amer 03/18/2010   >60 mL/min Final                   Value:>60                                The eGFR has been calculated                         using the MDRD equation.                         This calculation has not been                         validated in all clinical                         situations.                         eGFR's persistently                         <60 mL/min signify                         possible Chronic Kidney Disease.  Marland Kitchen Squamous Epithelial / LPF 03/18/2010 RARE  RARE Final  . WBC, UA 03/18/2010 0-2  <3 WBC/hpf Final  . RBC / HPF 03/18/2010 0-2  <3 RBC/hpf Final  . Bacteria, UA 03/18/2010 FEW* RARE Final  . Urine-Other 03/18/2010 MUCOUS PRESENT   Final  . Color, Urine 03/18/2010 YELLOW  YELLOW Final  . APPearance 03/18/2010 CLEAR  CLEAR Final  . Specific Gravity, Urine 03/18/2010 1.033* 1.005 - 1.030 Final  . pH 03/18/2010 5.5  5.0 - 8.0 Final  . Urine Glucose, Fasting 03/18/2010 NEGATIVE  NEGATIVE mg/dL Final  . Hgb urine dipstick 03/18/2010 TRACE* NEGATIVE Final  . Bilirubin Urine 03/18/2010 NEGATIVE  NEGATIVE Final  . Ketones, ur 03/18/2010 NEGATIVE  NEGATIVE mg/dL Final  . Protein, ur 03/18/2010 30* NEGATIVE mg/dL Final  . Urobilinogen, UA 03/18/2010 0.2  0.0 - 1.0 mg/dL Final  . Nitrite 03/18/2010 NEGATIVE  NEGATIVE Final  . Leukocytes, UA 03/18/2010 NEGATIVE  NEGATIVE Final    No results found.   Assessment/Plan   ICD-9-CM ICD-10-CM   1. Obesity 278.00 E66.9 phentermine 30 MG capsule  2. Insomnia 780.52 G47.00   3. Essential hypertension 401.9 M38 Basic Metabolic Panel  4. Hot flashes 627.2 N95.1    Resume trazodone. Take 1  tablet at bedtime. If no response, may take 2 tablets.  Continue all other medications as ordered  Follow up in 6 weeks for routine visit   Grant Henkes S. Perlie Gold  [facility] [address] [place], [place] [address] [phone] Cell (Monday-Friday 8 AM - 5 PM) [phone] After 5 PM and follow prompts

## 2015-05-23 LAB — BASIC METABOLIC PANEL
BUN / CREAT RATIO: 16 (ref 9–23)
BUN: 13 mg/dL (ref 6–24)
CO2: 22 mmol/L (ref 18–29)
CREATININE: 0.82 mg/dL (ref 0.57–1.00)
Calcium: 9.3 mg/dL (ref 8.7–10.2)
Chloride: 100 mmol/L (ref 96–106)
GFR, EST AFRICAN AMERICAN: 94 mL/min/{1.73_m2} (ref >59)
GFR, EST NON AFRICAN AMERICAN: 81 mL/min/{1.73_m2} (ref >59)
Glucose: 85 mg/dL (ref 65–99)
Potassium: 4.3 mmol/L (ref 3.5–5.2)
Sodium: 137 mmol/L (ref 134–144)

## 2015-05-24 ENCOUNTER — Telehealth: Payer: Self-pay | Admitting: *Deleted

## 2015-05-24 NOTE — Telephone Encounter (Signed)
Received fax from [facility] for prior authorization for Phentermine HCL 30mg  once daily. Filled out form and given to Dr. Eulas Post to review and sign.  To be faxed back to [facility] Fax#: [phone] Patient ID#: [id number] Angela Frost

## 2015-05-28 NOTE — Telephone Encounter (Signed)
Received fax from [facility] regarding prior authorization questions for Phentermine 30mg  once daily. Questions to be answered by Dr. Eulas Post and signed. To be faxed back to [phone]. Placed in Dr. Vale Haven folder.

## 2015-06-21 ENCOUNTER — Other Ambulatory Visit: Payer: Self-pay | Admitting: *Deleted

## 2015-06-21 DIAGNOSIS — E669 Obesity, unspecified: Secondary | ICD-10-CM

## 2015-06-21 DIAGNOSIS — R232 Flushing: Secondary | ICD-10-CM

## 2015-06-21 DIAGNOSIS — G4700 Insomnia, unspecified: Secondary | ICD-10-CM

## 2015-06-21 DIAGNOSIS — I10 Essential (primary) hypertension: Secondary | ICD-10-CM

## 2015-06-21 MED ORDER — PHENTERMINE HCL 30 MG PO CAPS: 30.0000 mg | ORAL_CAPSULE | ORAL | Status: DC

## 2015-06-21 MED ORDER — TRAZODONE HCL 50 MG PO TABS: 100.0000 mg | ORAL_TABLET | Freq: Every day | ORAL | Status: DC

## 2015-06-21 MED ORDER — VALSARTAN-HYDROCHLOROTHIAZIDE 80-12.5 MG PO TABS: 1.0000 | ORAL_TABLET | Freq: Every day | ORAL | Status: DC

## 2015-06-21 MED ORDER — PAROXETINE HCL 10 MG PO TABS: 10.0000 mg | ORAL_TABLET | Freq: Every day | ORAL | Status: DC

## 2015-06-21 NOTE — Telephone Encounter (Signed)
Patient requested to be sent to pharmacy.  

## 2015-06-25 ENCOUNTER — Other Ambulatory Visit: Payer: Self-pay | Admitting: *Deleted

## 2015-06-25 ENCOUNTER — Telehealth: Payer: Self-pay

## 2015-06-25 DIAGNOSIS — G4700 Insomnia, unspecified: Secondary | ICD-10-CM

## 2015-06-25 DIAGNOSIS — R232 Flushing: Secondary | ICD-10-CM

## 2015-06-25 MED ORDER — PAROXETINE HCL 10 MG PO TABS: 10.0000 mg | ORAL_TABLET | Freq: Every day | ORAL | Status: DC

## 2015-06-25 NOTE — Telephone Encounter (Signed)
Prior authorization was received for phentermine 30 mg capsules. Prior authorization was initiated via [email or web address]. Keyword: [id number]   Awaiting determination.

## 2015-06-25 NOTE — Telephone Encounter (Signed)
[facility] 

## 2015-06-26 NOTE — Telephone Encounter (Addendum)
Letter received from [facility] approving Phentermine HCI 30 MG (06-25-15 thru 09-25-15). Will fax to patients pharmacy [facility].

## 2015-07-05 ENCOUNTER — Encounter: Payer: Self-pay | Admitting: Internal Medicine

## 2015-07-05 ENCOUNTER — Ambulatory Visit (INDEPENDENT_AMBULATORY_CARE_PROVIDER_SITE_OTHER): Payer: BLUE CROSS/BLUE SHIELD | Admitting: Internal Medicine

## 2015-07-05 VITALS — BP 120/80 | HR 68 | Temp 98.2°F | Ht 65.0 in | Wt 181.0 lb

## 2015-07-05 DIAGNOSIS — I10 Essential (primary) hypertension: Secondary | ICD-10-CM

## 2015-07-05 DIAGNOSIS — Z1239 Encounter for other screening for malignant neoplasm of breast: Secondary | ICD-10-CM | POA: Diagnosis not present

## 2015-07-05 DIAGNOSIS — R232 Flushing: Secondary | ICD-10-CM

## 2015-07-05 DIAGNOSIS — G4700 Insomnia, unspecified: Secondary | ICD-10-CM | POA: Diagnosis not present

## 2015-07-05 DIAGNOSIS — E669 Obesity, unspecified: Secondary | ICD-10-CM | POA: Diagnosis not present

## 2015-07-05 DIAGNOSIS — N951 Menopausal and female climacteric states: Secondary | ICD-10-CM | POA: Diagnosis not present

## 2015-07-05 NOTE — Progress Notes (Signed)
Location:    [facility]   Place of Service:   OFFICE  Chief Complaint  Patient presents with  . Medical Management of Chronic Issues    6 weeks follow-up    HPI:  55 yo female seen today for f/u. She reports she is feeling well overall. No palpitations, HA, dizziness, CP, SOB.  HTN - BP improved on diovan hct. Some urinary frequency and has nocturia (2x per night)  Vasomotor sx's/insomnia - hot flashes and night sweats improved on paxil. Sleeping much better  hyperlipidemia - pt prefers to watch diet. Total cholesterol 236, HDL 59 and LDL 165 in July 2016  Obesity - improving on phentermine. Weight down 14 lbs. She exercises daily and has healthy diet  Last colonoscopy at age 28 with [facility], mammogram >17yrs ago, pap 5 yrs ago   Past Medical History  Diagnosis Date  . Hypertension   . Hot flashes   . Overweight     Past Surgical History  Procedure Laterality Date  . Abdominal hysterectomy  07/18/2011    Dr.Mattern   . Colonoscopy  2016    Patient Reported     Patient Care Team: Gildardo Cranker, DO as PCP - General (Internal Medicine)  Social History   Social History  . Marital Status: Married    Spouse Name: N/A  . Number of Children: N/A  . Years of Education: N/A   Occupational History  . Not on file.   Social History Main Topics  . Smoking status: Former Smoker -- 4.00 packs/day for 2 years    Types: Cigarettes  . Smokeless tobacco: Never Used  . Alcohol Use: No  . Drug Use: No  . Sexual Activity: Not on file   Other Topics Concern  . Not on file   Social History Narrative   Diet: None   Caffeine: Yes   Married: Yes, 2001   House: 2 stories, 2 persons   Pets: No   Current/Past profession: None listed    Exercise: Yes   Living Will: Yes   DNR: N/A   POA/HPOA: N/A           reports that she has quit smoking. Her smoking use included Cigarettes. She has a 8 pack-year smoking history. She has never used smokeless tobacco. She reports  that she does not drink alcohol or use illicit drugs.  Allergies  Allergen Reactions  . Estradiol Itching    Medications: Patient's Medications  New Prescriptions   No medications on file  Previous Medications   DIPHENHYDRAMINE (BENADRYL) 25 MG CAPSULE    Take 25 mg by mouth daily.   FAMOTIDINE (PEPCID) 20 MG TABLET    Take 20 mg by mouth daily. PRN   NAFTIFINE HCL 2 % CREA    Apply topically. Apply twice a day   PAROXETINE (PAXIL) 10 MG TABLET    Take 1 tablet (10 mg total) by mouth daily.   PHENTERMINE 30 MG CAPSULE    Take 1 capsule (30 mg total) by mouth every morning.   TRAZODONE (DESYREL) 50 MG TABLET    Take 2 tablets (100 mg total) by mouth at bedtime.   VALSARTAN-HYDROCHLOROTHIAZIDE (DIOVAN HCT) 80-12.5 MG TABLET    Take 1 tablet by mouth daily.  Modified Medications   No medications on file  Discontinued Medications   No medications on file    Review of Systems  Constitutional: Negative for fever, chills, diaphoresis, activity change, appetite change and fatigue.  HENT: Negative for ear  pain and sore throat.   Eyes: Negative for visual disturbance.  Respiratory: Negative for cough, chest tightness and shortness of breath.   Cardiovascular: Negative for chest pain, palpitations and leg swelling.  Gastrointestinal: Negative for nausea, vomiting, abdominal pain, diarrhea, constipation and blood in stool.  Genitourinary: Negative for dysuria.  Musculoskeletal: Negative for arthralgias.  Neurological: Negative for dizziness, tremors, numbness and headaches.  Psychiatric/Behavioral: Negative for sleep disturbance. The patient is not nervous/anxious.     Filed Vitals:   07/05/15 0821  BP: 120/80  Pulse: 68  Temp: 98.2 F (36.8 C)  TempSrc: Oral  Height: 5\' 5"  (1.651 m)  Weight: 181 lb (82.101 kg)  SpO2: 98%   Body mass index is 30.12 kg/(m^2).  Physical Exam  Constitutional: She is oriented to person, place, and time. She appears well-developed and  well-nourished.  HENT:  Mouth/Throat: Oropharynx is clear and moist. No oropharyngeal exudate.  Eyes: Pupils are equal, round, and reactive to light. No scleral icterus.  Neck: Neck supple. Carotid bruit is not present. No tracheal deviation present. No thyromegaly present.  Cardiovascular: Normal rate, regular rhythm, normal heart sounds and intact distal pulses.  Exam reveals no gallop and no friction rub.   No murmur heard. No LE edema b/l. no calf TTP.   Pulmonary/Chest: Effort normal and breath sounds normal. No stridor. No respiratory distress. She has no wheezes. She has no rales.  Abdominal: Soft. Bowel sounds are normal. She exhibits no distension and no mass. There is no hepatomegaly. There is no tenderness. There is no rebound and no guarding.  Lymphadenopathy:    She has no cervical adenopathy.  Neurological: She is alert and oriented to person, place, and time. She displays no tremor.  Skin: Skin is warm and dry. No rash noted.  Psychiatric: She has a normal mood and affect. Her behavior is normal. Judgment and thought content normal. She is not aggressive. She does not exhibit a depressed mood.     Labs reviewed: Office Visit on 05/22/2015  Component Date Value Ref Range Status  . Glucose 05/22/2015 85  65 - 99 mg/dL Final  . BUN 05/22/2015 13  6 - 24 mg/dL Final  . Creatinine, Ser 05/22/2015 0.82  0.57 - 1.00 mg/dL Final  . GFR calc non Af Amer 05/22/2015 81  >59 mL/min/1.73 Final  . GFR calc Af Amer 05/22/2015 94  >59 mL/min/1.73 Final  . BUN/Creatinine Ratio 05/22/2015 16  9 - 23 Final                 **Please note reference interval change**  . Sodium 05/22/2015 137  134 - 144 mmol/L Final  . Potassium 05/22/2015 4.3  3.5 - 5.2 mmol/L Final  . Chloride 05/22/2015 100  96 - 106 mmol/L Final  . CO2 05/22/2015 22  18 - 29 mmol/L Final  . Calcium 05/22/2015 9.3  8.7 - 10.2 mg/dL Final    No results found.   Assessment/Plan   ICD-9-CM ICD-10-CM   1. Obesity -  improving 278.00 E66.9 CMP  2. Essential hypertension 401.9 I10 CMP  3. Insomnia 780.52 G47.00   4. Hot flashes 627.2 N95.1   5. Breast cancer screening V76.10 Z12.39 MM DIGITAL SCREENING BILATERAL   Recommend follow up with GYN for routine annual exam  Will call with mammogram appt - she prefers [facility]  Will call with lab results  Get colonoscopy report from [facility] in [place]  Follow up in 3 mos for routine visit  Angela Frost S. Perlie Gold  [facility] [address] [place], [place] [address] [phone] Cell (Monday-Friday 8 AM - 5 PM) [phone] After 5 PM and follow prompts

## 2015-07-05 NOTE — Patient Instructions (Signed)
Recommend follow up with GYN for routine annual exam  Will call with mammogram appt  Will call with lab results  Get colonoscopy report from [facility] in [place]  Follow up in 3 mos

## 2015-07-06 LAB — COMPREHENSIVE METABOLIC PANEL
A/G RATIO: 1.6 (ref 1.2–2.2)
ALT: 15 IU/L (ref 0–32)
AST: 20 IU/L (ref 0–40)
Albumin: 4.2 g/dL (ref 3.5–5.5)
Alkaline Phosphatase: 90 IU/L (ref 39–117)
BUN/Creatinine Ratio: 17 (ref 9–23)
BUN: 16 mg/dL (ref 6–24)
Bilirubin Total: 0.3 mg/dL (ref 0.0–1.2)
CALCIUM: 9.5 mg/dL (ref 8.7–10.2)
CO2: 25 mmol/L (ref 18–29)
Chloride: 102 mmol/L (ref 96–106)
Creatinine, Ser: 0.95 mg/dL (ref 0.57–1.00)
GFR calc Af Amer: 79 mL/min/{1.73_m2} (ref >59)
GFR, EST NON AFRICAN AMERICAN: 68 mL/min/{1.73_m2} (ref >59)
Globulin, Total: 2.7 g/dL (ref 1.5–4.5)
Glucose: 98 mg/dL (ref 65–99)
POTASSIUM: 4.1 mmol/L (ref 3.5–5.2)
Sodium: 140 mmol/L (ref 134–144)
Total Protein: 6.9 g/dL (ref 6.0–8.5)

## 2015-07-17 ENCOUNTER — Encounter: Payer: Self-pay | Admitting: *Deleted

## 2015-08-27 ENCOUNTER — Other Ambulatory Visit: Payer: Self-pay | Admitting: Internal Medicine

## 2015-09-09 LAB — HM MAMMOGRAPHY

## 2015-09-10 ENCOUNTER — Encounter: Payer: Self-pay | Admitting: *Deleted

## 2015-11-06 ENCOUNTER — Other Ambulatory Visit: Payer: Self-pay | Admitting: Nurse Practitioner

## 2015-11-07 NOTE — Telephone Encounter (Signed)
Pt of Dr Eulas Post, will defer this to her

## 2015-11-07 NOTE — Telephone Encounter (Signed)
She needs a f/u appt in order to get  a RF

## 2015-11-17 LAB — HM PAP SMEAR: HM PAP: NORMAL

## 2016-01-12 ENCOUNTER — Other Ambulatory Visit: Payer: Self-pay | Admitting: Internal Medicine

## 2016-01-17 ENCOUNTER — Encounter: Payer: Self-pay | Admitting: Internal Medicine

## 2016-01-17 ENCOUNTER — Ambulatory Visit (INDEPENDENT_AMBULATORY_CARE_PROVIDER_SITE_OTHER): Payer: 59 | Admitting: Internal Medicine

## 2016-01-17 VITALS — BP 110/70 | HR 97 | Temp 98.0°F | Ht 65.0 in | Wt 184.2 lb

## 2016-01-17 DIAGNOSIS — S134XXA Sprain of ligaments of cervical spine, initial encounter: Secondary | ICD-10-CM | POA: Diagnosis not present

## 2016-01-17 DIAGNOSIS — M5441 Lumbago with sciatica, right side: Secondary | ICD-10-CM | POA: Diagnosis not present

## 2016-01-17 DIAGNOSIS — M7061 Trochanteric bursitis, right hip: Secondary | ICD-10-CM

## 2016-01-17 MED ORDER — PREDNISONE 10 MG PO TABS: ORAL_TABLET | ORAL | 0 refills | Status: DC
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
## 2016-01-17 NOTE — Patient Instructions (Addendum)
Continue meloxicam  Prednisone taper as ordered  Will call with PT referral  Recommend take 1/4 of tablet of norco and zanaflex to improve pain control  Follow up in 1 month for routine visit

## 2016-01-27 DIAGNOSIS — Z029 Encounter for administrative examinations, unspecified: Secondary | ICD-10-CM

## 2016-02-04 ENCOUNTER — Telehealth: Payer: Self-pay

## 2016-02-04 NOTE — Telephone Encounter (Signed)
Message left on clinical intake voicemail:  Patient c/o leg cramps every other day and would like recommendations.  Please advise

## 2016-02-04 NOTE — Telephone Encounter (Signed)
I need more information regarding her leg cramps - onset, timing, has she tried anything, etc

## 2016-02-06 NOTE — Telephone Encounter (Signed)
Sounds like restless leg syndrome - Rx requip 0.25mg  #60 take 1 tab po qhs x 3 days then increase to 2 tabs po qhs with 3 RF

## 2016-02-06 NOTE — Telephone Encounter (Signed)
Called patient and she stated that she gets the cramps mostly at night but on Thanksgiving day she got them during the day while sitting in the chair. Stated that mostly though it is at night and they won't go away, it wakes her up out of her sleep. She has tried Yellow Mustard and bananas with no relief. Please Advise.

## 2016-02-07 MED ORDER — ROPINIROLE HCL 0.25 MG PO TABS: ORAL_TABLET | ORAL | 3 refills | Status: DC

## 2016-02-07 NOTE — Telephone Encounter (Signed)
Patient notified and agreed. Rx faxed to pharmacy.  

## 2016-02-21 ENCOUNTER — Other Ambulatory Visit: Payer: Self-pay | Admitting: Internal Medicine

## 2016-02-21 ENCOUNTER — Encounter: Payer: Self-pay | Admitting: Internal Medicine

## 2016-02-21 ENCOUNTER — Ambulatory Visit (INDEPENDENT_AMBULATORY_CARE_PROVIDER_SITE_OTHER): Payer: 59 | Admitting: Internal Medicine

## 2016-02-21 VITALS — BP 144/94 | HR 75 | Temp 97.8°F | Ht 65.0 in | Wt 191.2 lb

## 2016-02-21 DIAGNOSIS — R252 Cramp and spasm: Secondary | ICD-10-CM | POA: Diagnosis not present

## 2016-02-21 DIAGNOSIS — I10 Essential (primary) hypertension: Secondary | ICD-10-CM

## 2016-02-21 DIAGNOSIS — E6609 Other obesity due to excess calories: Secondary | ICD-10-CM | POA: Diagnosis not present

## 2016-02-21 DIAGNOSIS — Z6831 Body mass index (BMI) 31.0-31.9, adult: Secondary | ICD-10-CM

## 2016-02-21 DIAGNOSIS — M5441 Lumbago with sciatica, right side: Principal | ICD-10-CM

## 2016-02-21 DIAGNOSIS — G4700 Insomnia, unspecified: Secondary | ICD-10-CM

## 2016-02-21 DIAGNOSIS — G8929 Other chronic pain: Secondary | ICD-10-CM

## 2016-02-21 DIAGNOSIS — R232 Flushing: Secondary | ICD-10-CM

## 2016-02-21 LAB — BASIC METABOLIC PANEL WITH GFR
BUN: 18 mg/dL (ref 7–25)
CHLORIDE: 102 mmol/L (ref 98–110)
CO2: 27 mmol/L (ref 20–31)
CREATININE: 1.28 mg/dL — AB (ref 0.50–1.05)
Calcium: 9.2 mg/dL (ref 8.6–10.4)
GFR, Est African American: 54 mL/min — ABNORMAL LOW (ref >=60)
GFR, Est Non African American: 47 mL/min — ABNORMAL LOW (ref >=60)
GLUCOSE: 88 mg/dL (ref 65–99)
Potassium: 3.9 mmol/L (ref 3.5–5.3)
Sodium: 137 mmol/L (ref 135–146)

## 2016-02-21 MED ORDER — TIZANIDINE HCL 4 MG PO TABS: 4.0000 mg | ORAL_TABLET | Freq: Three times a day (TID) | ORAL | 1 refills | Status: DC | PRN

## 2016-02-21 MED ORDER — PHENTERMINE HCL 37.5 MG PO CAPS: 37.5000 mg | ORAL_CAPSULE | ORAL | 4 refills | Status: DC

## 2016-02-21 MED ORDER — PAROXETINE HCL 20 MG PO TABS: 20.0000 mg | ORAL_TABLET | Freq: Every day | ORAL | 6 refills | Status: DC

## 2016-02-21 NOTE — Patient Instructions (Addendum)
Continue current medications as ordered  Recommend follow up with PT for back pain and hip pain  Increase paxil to 20mg  daily  Follow up in 3 mos for routine visit. Fasting labs prior to appt
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
## 2016-02-27 ENCOUNTER — Ambulatory Visit: Payer: 59 | Attending: Internal Medicine | Admitting: Physical Therapy

## 2016-02-27 DIAGNOSIS — R293 Abnormal posture: Secondary | ICD-10-CM | POA: Insufficient documentation

## 2016-02-27 DIAGNOSIS — M25551 Pain in right hip: Secondary | ICD-10-CM | POA: Diagnosis present

## 2016-02-27 DIAGNOSIS — M5441 Lumbago with sciatica, right side: Secondary | ICD-10-CM | POA: Diagnosis present

## 2016-02-27 DIAGNOSIS — M6281 Muscle weakness (generalized): Secondary | ICD-10-CM | POA: Diagnosis present

## 2016-02-27 DIAGNOSIS — M5442 Lumbago with sciatica, left side: Secondary | ICD-10-CM | POA: Diagnosis not present

## 2016-02-27 DIAGNOSIS — M542 Cervicalgia: Secondary | ICD-10-CM | POA: Insufficient documentation
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
## 2016-03-01 ENCOUNTER — Other Ambulatory Visit: Payer: Self-pay | Admitting: Internal Medicine

## 2016-03-01 DIAGNOSIS — I10 Essential (primary) hypertension: Secondary | ICD-10-CM

## 2016-03-09 ENCOUNTER — Ambulatory Visit: Payer: 59 | Admitting: Physical Therapy

## 2016-03-09 DIAGNOSIS — M25551 Pain in right hip: Secondary | ICD-10-CM

## 2016-03-09 DIAGNOSIS — R293 Abnormal posture: Secondary | ICD-10-CM

## 2016-03-09 DIAGNOSIS — M542 Cervicalgia: Secondary | ICD-10-CM

## 2016-03-09 DIAGNOSIS — M5442 Lumbago with sciatica, left side: Secondary | ICD-10-CM

## 2016-03-09 DIAGNOSIS — M6281 Muscle weakness (generalized): Secondary | ICD-10-CM

## 2016-03-09 DIAGNOSIS — M5441 Lumbago with sciatica, right side: Secondary | ICD-10-CM
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
## 2016-03-10 ENCOUNTER — Ambulatory Visit: Payer: 59

## 2016-03-11 ENCOUNTER — Ambulatory Visit: Payer: 59

## 2016-03-11 DIAGNOSIS — R293 Abnormal posture: Secondary | ICD-10-CM

## 2016-03-11 DIAGNOSIS — M5442 Lumbago with sciatica, left side: Secondary | ICD-10-CM

## 2016-03-11 DIAGNOSIS — M25551 Pain in right hip: Secondary | ICD-10-CM

## 2016-03-11 DIAGNOSIS — M6281 Muscle weakness (generalized): Secondary | ICD-10-CM

## 2016-03-11 DIAGNOSIS — M542 Cervicalgia: Secondary | ICD-10-CM

## 2016-03-11 DIAGNOSIS — M5441 Lumbago with sciatica, right side: Secondary | ICD-10-CM
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
## 2016-03-11 NOTE — Patient Instructions (Addendum)

## 2016-03-16 ENCOUNTER — Ambulatory Visit: Payer: 59 | Admitting: Physical Therapy

## 2016-03-16 DIAGNOSIS — M5442 Lumbago with sciatica, left side: Secondary | ICD-10-CM | POA: Diagnosis not present

## 2016-03-16 DIAGNOSIS — M5441 Lumbago with sciatica, right side: Secondary | ICD-10-CM

## 2016-03-16 DIAGNOSIS — M25551 Pain in right hip: Secondary | ICD-10-CM

## 2016-03-16 DIAGNOSIS — R293 Abnormal posture: Secondary | ICD-10-CM

## 2016-03-16 DIAGNOSIS — M542 Cervicalgia: Secondary | ICD-10-CM

## 2016-03-16 DIAGNOSIS — M6281 Muscle weakness (generalized): Secondary | ICD-10-CM
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
## 2016-03-23 ENCOUNTER — Ambulatory Visit: Payer: 59 | Admitting: Physical Therapy

## 2016-03-25 ENCOUNTER — Ambulatory Visit: Payer: 59 | Admitting: Physical Therapy

## 2016-03-30 ENCOUNTER — Ambulatory Visit: Payer: 59 | Attending: Internal Medicine | Admitting: Physical Therapy

## 2016-03-30 ENCOUNTER — Telehealth: Payer: Self-pay

## 2016-03-30 DIAGNOSIS — M6281 Muscle weakness (generalized): Secondary | ICD-10-CM | POA: Insufficient documentation

## 2016-03-30 DIAGNOSIS — M5441 Lumbago with sciatica, right side: Secondary | ICD-10-CM | POA: Insufficient documentation

## 2016-03-30 DIAGNOSIS — M5442 Lumbago with sciatica, left side: Secondary | ICD-10-CM | POA: Insufficient documentation

## 2016-03-30 DIAGNOSIS — R293 Abnormal posture: Secondary | ICD-10-CM | POA: Diagnosis present

## 2016-03-30 DIAGNOSIS — M542 Cervicalgia: Secondary | ICD-10-CM | POA: Insufficient documentation

## 2016-03-30 DIAGNOSIS — M25551 Pain in right hip: Secondary | ICD-10-CM | POA: Diagnosis present
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
## 2016-03-30 NOTE — Telephone Encounter (Signed)
A prior authorization was received from [facility] [employer] for phentermine 37.5 mg capsules. Form was partially completed and placed in Dr. Vale Haven folder along with last OV notes, for completion and signing.   I did call patient to see what other mediations she has used in the past for weight control. Patient stated that she has not taken any other medication for her weight.   Please fax completed form and last OV note to [phone].

## 2016-04-01 ENCOUNTER — Ambulatory Visit: Payer: 59 | Admitting: Physical Therapy

## 2016-04-01 DIAGNOSIS — M6281 Muscle weakness (generalized): Secondary | ICD-10-CM

## 2016-04-01 DIAGNOSIS — R293 Abnormal posture: Secondary | ICD-10-CM

## 2016-04-01 DIAGNOSIS — M542 Cervicalgia: Secondary | ICD-10-CM

## 2016-04-01 DIAGNOSIS — M5442 Lumbago with sciatica, left side: Secondary | ICD-10-CM

## 2016-04-01 DIAGNOSIS — M5441 Lumbago with sciatica, right side: Secondary | ICD-10-CM

## 2016-04-01 DIAGNOSIS — M25551 Pain in right hip: Secondary | ICD-10-CM
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
## 2016-04-06 ENCOUNTER — Ambulatory Visit: Payer: 59 | Admitting: Physical Therapy

## 2016-04-06 ENCOUNTER — Encounter: Payer: Self-pay | Admitting: Internal Medicine

## 2016-04-06 DIAGNOSIS — M542 Cervicalgia: Secondary | ICD-10-CM

## 2016-04-06 DIAGNOSIS — M5442 Lumbago with sciatica, left side: Secondary | ICD-10-CM | POA: Diagnosis not present

## 2016-04-06 DIAGNOSIS — M25551 Pain in right hip: Secondary | ICD-10-CM

## 2016-04-06 DIAGNOSIS — R293 Abnormal posture: Secondary | ICD-10-CM

## 2016-04-06 DIAGNOSIS — M6281 Muscle weakness (generalized): Secondary | ICD-10-CM

## 2016-04-06 DIAGNOSIS — M5441 Lumbago with sciatica, right side: Secondary | ICD-10-CM

## 2016-04-06 NOTE — Telephone Encounter (Signed)
Received fax from [place] [phone] stating the Phentermine was DENIED due to not meeting requirement. Determination given to Dr. Eulas Post to review.
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
## 2016-04-08 ENCOUNTER — Ambulatory Visit: Payer: 59

## 2016-04-10 ENCOUNTER — Other Ambulatory Visit: Payer: Self-pay | Admitting: Internal Medicine

## 2016-04-10 DIAGNOSIS — I10 Essential (primary) hypertension: Secondary | ICD-10-CM

## 2016-04-10 DIAGNOSIS — E6609 Other obesity due to excess calories: Secondary | ICD-10-CM

## 2016-04-10 DIAGNOSIS — Z0000 Encounter for general adult medical examination without abnormal findings: Secondary | ICD-10-CM

## 2016-04-10 DIAGNOSIS — Z6831 Body mass index (BMI) 31.0-31.9, adult: Secondary | ICD-10-CM

## 2016-04-10 DIAGNOSIS — Z79899 Other long term (current) drug therapy: Secondary | ICD-10-CM

## 2016-04-13 ENCOUNTER — Ambulatory Visit: Payer: 59 | Admitting: Physical Therapy

## 2016-04-15 ENCOUNTER — Ambulatory Visit: Payer: 59

## 2016-04-20 ENCOUNTER — Ambulatory Visit (INDEPENDENT_AMBULATORY_CARE_PROVIDER_SITE_OTHER): Payer: 59 | Admitting: Nurse Practitioner

## 2016-04-20 ENCOUNTER — Other Ambulatory Visit: Payer: Self-pay | Admitting: Nurse Practitioner

## 2016-04-20 ENCOUNTER — Encounter: Payer: Self-pay | Admitting: Nurse Practitioner

## 2016-04-20 VITALS — BP 132/78 | HR 82 | Temp 97.7°F | Resp 17 | Ht 65.0 in | Wt 181.8 lb

## 2016-04-20 DIAGNOSIS — R232 Flushing: Secondary | ICD-10-CM

## 2016-04-20 LAB — COMPLETE METABOLIC PANEL WITH GFR
ALT: 12 U/L (ref 6–29)
AST: 17 U/L (ref 10–35)
Albumin: 4.2 g/dL (ref 3.6–5.1)
Alkaline Phosphatase: 89 U/L (ref 33–130)
BUN: 15 mg/dL (ref 7–25)
CHLORIDE: 101 mmol/L (ref 98–110)
CO2: 27 mmol/L (ref 20–31)
Calcium: 9.5 mg/dL (ref 8.6–10.4)
Creat: 0.88 mg/dL (ref 0.50–1.05)
GFR, Est African American: 86 mL/min (ref >=60)
GFR, Est Non African American: 74 mL/min (ref >=60)
GLUCOSE: 89 mg/dL (ref 65–99)
POTASSIUM: 3.6 mmol/L (ref 3.5–5.3)
Sodium: 138 mmol/L (ref 135–146)
Total Bilirubin: 0.4 mg/dL (ref 0.2–1.2)
Total Protein: 6.6 g/dL (ref 6.1–8.1)

## 2016-04-20 LAB — CBC WITH DIFFERENTIAL/PLATELET
BASOS ABS: 53 {cells}/uL (ref 0–200)
BASOS PCT: 1 %
EOS ABS: 106 {cells}/uL (ref 15–500)
Eosinophils Relative: 2 %
HCT: 42.2 % (ref 35.0–45.0)
HEMOGLOBIN: 13.9 g/dL (ref 11.7–15.5)
LYMPHS ABS: 2279 {cells}/uL (ref 850–3900)
Lymphocytes Relative: 43 %
MCH: 27.1 pg (ref 27.0–33.0)
MCHC: 32.9 g/dL (ref 32.0–36.0)
MCV: 82.3 fL (ref 80.0–100.0)
MPV: 9.8 fL (ref 7.5–12.5)
Monocytes Absolute: 318 cells/uL (ref 200–950)
Monocytes Relative: 6 %
NEUTROS ABS: 2544 {cells}/uL (ref 1500–7800)
Neutrophils Relative %: 48 %
Platelets: 250 10*3/uL (ref 140–400)
RBC: 5.13 MIL/uL — ABNORMAL HIGH (ref 3.80–5.10)
RDW: 13.6 % (ref 11.0–15.0)
WBC: 5.3 10*3/uL (ref 3.8–10.8)

## 2016-04-20 LAB — TSH: TSH: 1.51 m[IU]/L

## 2016-04-20 MED ORDER — PAROXETINE HCL 10 MG PO TABS: 10.0000 mg | ORAL_TABLET | Freq: Every day | ORAL | 1 refills | Status: DC

## 2016-04-20 NOTE — Patient Instructions (Signed)
Restart paxil 10 mg daily  Will also get blood work today

## 2016-04-20 NOTE — Progress Notes (Signed)
Careteam: Patient Care Team: Gildardo Cranker, DO as PCP - General (Internal Medicine)  Advanced Directive information Does Patient Have a Medical Advance Directive?: No  Allergies  Allergen Reactions  . Estradiol Itching    Chief Complaint  Patient presents with  . Acute Visit    Pt reports having chills throughout day and night with excessive sweating.     HPI: Patient is a 56 y.o. female seen in the office today due to excessive sweating and cold chills for about a week. Bad over the last 2 days.  Hx of hysterectomy and hot flashes No fevers but chills.  Feeling nauseous but thought this was due to not sleeping. Not sleeping because of the night sweats. A year ago had similar symptoms, went away on its own then.  No anxiety or depression.  Started drinking more water at the beginning of the year.   After reviewing the chart pt was placed on paxil for these symptoms that improved in the past. Pt does not remember this but did state it did get better after she was on it. Not currently taking paxil even though it is on her medication list. Unsure when she stopped medication.    Review of Systems:  Review of Systems  Constitutional: Positive for appetite change, chills, diaphoresis and fatigue. Negative for activity change and fever.  HENT: Negative for rhinorrhea and sore throat.   Eyes: Negative.  Negative for visual disturbance.  Respiratory: Negative for cough, chest tightness and shortness of breath.   Cardiovascular: Negative for chest pain, palpitations and leg swelling.  Gastrointestinal: Positive for abdominal pain (better today) and nausea. Negative for blood in stool, constipation, diarrhea and vomiting.  Genitourinary: Negative for difficulty urinating and dysuria.  Musculoskeletal: Negative for arthralgias.  Neurological: Negative for dizziness, tremors, numbness and headaches.  Psychiatric/Behavioral: Positive for sleep disturbance. The patient is not  nervous/anxious.     Past Medical History:  Diagnosis Date  . Hot flashes   . Hypertension   . Overweight    Past Surgical History:  Procedure Laterality Date  . ABDOMINAL HYSTERECTOMY  07/18/2011   Dr.Mattern   . COLONOSCOPY  2016   Patient Reported    Social History:   reports that she has quit smoking. Her smoking use included Cigarettes. She has a 8.00 pack-year smoking history. She has never used smokeless tobacco. She reports that she does not drink alcohol or use drugs.  Family History  Problem Relation Age of Onset  . High blood pressure Father   . Heart disease Mother   . Diabetes Sister   . Obesity Sister   . Diabetes Brother   . Arthritis Brother   . High blood pressure Sister   . High blood pressure Sister   . High blood pressure Brother     Medications: Patient's Medications  New Prescriptions   No medications on file  Previous Medications   NAFTIFINE HCL 2 % CREA    Apply topically. Apply twice a day   PAROXETINE (PAXIL) 20 MG TABLET    Take 1 tablet (20 mg total) by mouth daily.   PHENTERMINE 37.5 MG CAPSULE    Take 1 capsule (37.5 mg total) by mouth every morning.   ROPINIROLE (REQUIP) 0.25 MG TABLET    Take one tablet by mouth at bedtime for 3 days then increase to 2 tablets by mouth at bedtime   TIZANIDINE (ZANAFLEX) 4 MG TABLET    Take 1 tablet (4 mg total) by mouth every  8 (eight) hours as needed for muscle spasms.   TRAZODONE (DESYREL) 50 MG TABLET    Take 2 tablets (100 mg total) by mouth at bedtime.   VALSARTAN-HYDROCHLOROTHIAZIDE (DIOVAN-HCT) 80-12.5 MG TABLET    TAKE 1 TABLET BY MOUTH DAILY  Modified Medications   No medications on file  Discontinued Medications   HYDROCODONE-ACETAMINOPHEN (NORCO/VICODIN) 5-325 MG TABLET    Take 1-2 tablets by mouth at bedtime.   PREDNISONE (DELTASONE) 10 MG TABLET    Take 4 tabs po daily x 3 days then 3 tabs po daily x 3 then 2 tabs po daily x 3 then 1 tab po daily x 3 and stop     Physical Exam:  Vitals:    04/20/16 1505  BP: 132/78  Pulse: 82  Resp: 17  Temp: 97.7 F (36.5 C)  TempSrc: Oral  SpO2: 98%  Weight: 181 lb 12.8 oz (82.5 kg)  Height: '5\' 5"'$  (1.651 m)   Body mass index is 30.25 kg/m.  Physical Exam  Constitutional: She is oriented to person, place, and time. She appears well-developed and well-nourished. No distress.  HENT:  Head: Normocephalic and atraumatic.  Mouth/Throat: Oropharynx is clear and moist. No oropharyngeal exudate.  Eyes: Conjunctivae are normal. Pupils are equal, round, and reactive to light.  Neck: Normal range of motion. Neck supple.  Cardiovascular: Normal rate, regular rhythm and normal heart sounds.   Pulmonary/Chest: Effort normal and breath sounds normal.  Abdominal: Soft. Bowel sounds are normal.  Musculoskeletal: She exhibits no edema or tenderness.  Neurological: She is alert and oriented to person, place, and time.  Skin: Skin is warm and dry. She is not diaphoretic.  Psychiatric: She has a normal mood and affect.    Labs reviewed: Basic Metabolic Panel:  Recent Labs  05/22/15 1514 07/05/15 0849 02/21/16 1512  NA 137 140 137  K 4.3 4.1 3.9  CL 100 102 102  CO2 '22 25 27  '$ GLUCOSE 85 98 88  BUN '13 16 18  '$ CREATININE 0.82 0.95 1.28*  CALCIUM 9.3 9.5 9.2   Liver Function Tests:  Recent Labs  07/05/15 0849  AST 20  ALT 15  ALKPHOS 90  BILITOT 0.3  PROT 6.9  ALBUMIN 4.2   No results for input(s): LIPASE, AMYLASE in the last 8760 hours. No results for input(s): AMMONIA in the last 8760 hours. CBC: No results for input(s): WBC, NEUTROABS, HGB, HCT, MCV, PLT in the last 8760 hours. Lipid Panel: No results for input(s): CHOL, HDL, LDLCALC, TRIG, CHOLHDL, LDLDIRECT in the last 8760 hours. TSH: No results for input(s): TSH in the last 8760 hours. A1C: No results found for: HGBA1C   Assessment/Plan 1. Hot flashes -after reviewing epic this is not new for patient. She is currently not taking paxil and does not remember when  she stopped. Was taking In may of 2017 with good effects.  Will restart paxil at 10 mg daily- she was previously taking 20 mg but reports good benefit at 10 mg daily  - PARoxetine (PAXIL) 10 MG tablet; Take 1 tablet (10 mg total) by mouth daily.  Dispense: 30 tablet; Refill: 1 Will get labs to evaluate other causes at this time.  - TSH - CMP with eGFR - CBC with Differential/Platelets Total time  25 mins time greater than 50% of total time spent doing coordination of care and reviewing records  Follow up in 4 weeks  Abbigayle Toole K. Harle Battiest  [facility] [phone] 8 am - 5  pm) [phone] (after hours)

## 2016-04-27 ENCOUNTER — Other Ambulatory Visit: Payer: Self-pay | Admitting: Internal Medicine

## 2016-04-27 DIAGNOSIS — I10 Essential (primary) hypertension: Secondary | ICD-10-CM

## 2016-05-05 ENCOUNTER — Other Ambulatory Visit: Payer: Self-pay | Admitting: Internal Medicine

## 2016-05-06 ENCOUNTER — Other Ambulatory Visit: Payer: Self-pay | Admitting: Internal Medicine

## 2016-05-12 ENCOUNTER — Other Ambulatory Visit: Payer: Self-pay | Admitting: *Deleted

## 2016-05-12 MED ORDER — NAFTIFINE HCL 2 % EX CREA: TOPICAL_CREAM | CUTANEOUS | 1 refills | Status: DC

## 2016-05-12 NOTE — Telephone Encounter (Signed)
Patient requested refill

## 2016-05-14 ENCOUNTER — Telehealth: Payer: Self-pay

## 2016-05-14 NOTE — Telephone Encounter (Signed)
Naftifine cream is not covered please advise on alternative.

## 2016-05-14 NOTE — Telephone Encounter (Signed)
Left message on patients cell phone [phone] per her husband to give our office.    Reason for call: patient needs office visit for cream she requested.

## 2016-05-14 NOTE — Telephone Encounter (Signed)
Will need OV for this.

## 2016-05-18 ENCOUNTER — Other Ambulatory Visit: Payer: 59

## 2016-05-21 ENCOUNTER — Ambulatory Visit: Payer: 59 | Admitting: Nurse Practitioner

## 2016-05-22 ENCOUNTER — Ambulatory Visit: Payer: 59 | Admitting: Internal Medicine

## 2016-05-22 NOTE — Telephone Encounter (Signed)
Left message for patient to return call to office. 

## 2016-05-22 NOTE — Telephone Encounter (Signed)
Patient called and stated that she will wait. She stated that her feet normally breaks out around this time of the year and that's what she normally uses. She said she will wait and see if it gets worst before she makes an appointment.  Appointment offered patient declined.

## 2016-06-13 ENCOUNTER — Other Ambulatory Visit: Payer: Self-pay | Admitting: Internal Medicine

## 2016-06-15 ENCOUNTER — Other Ambulatory Visit: Payer: Self-pay | Admitting: Internal Medicine

## 2016-07-16 ENCOUNTER — Other Ambulatory Visit: Payer: Self-pay | Admitting: Internal Medicine

## 2016-07-28 ENCOUNTER — Other Ambulatory Visit: Payer: 59

## 2016-07-28 ENCOUNTER — Other Ambulatory Visit: Payer: Self-pay

## 2016-07-28 DIAGNOSIS — Z6831 Body mass index (BMI) 31.0-31.9, adult: Secondary | ICD-10-CM

## 2016-07-28 DIAGNOSIS — I10 Essential (primary) hypertension: Secondary | ICD-10-CM

## 2016-07-28 DIAGNOSIS — E6609 Other obesity due to excess calories: Secondary | ICD-10-CM

## 2016-07-28 DIAGNOSIS — Z0000 Encounter for general adult medical examination without abnormal findings: Secondary | ICD-10-CM

## 2016-07-28 DIAGNOSIS — Z79899 Other long term (current) drug therapy: Secondary | ICD-10-CM

## 2016-07-28 LAB — CBC WITH DIFFERENTIAL/PLATELET
BASOS ABS: 38 {cells}/uL (ref 0–200)
Basophils Relative: 1 %
Eosinophils Absolute: 190 cells/uL (ref 15–500)
Eosinophils Relative: 5 %
HCT: 40.4 % (ref 35.0–45.0)
Hemoglobin: 13.2 g/dL (ref 11.7–15.5)
LYMPHS PCT: 40 %
Lymphs Abs: 1520 cells/uL (ref 850–3900)
MCH: 27.6 pg (ref 27.0–33.0)
MCHC: 32.7 g/dL (ref 32.0–36.0)
MCV: 84.5 fL (ref 80.0–100.0)
MONOS PCT: 5 %
MPV: 9.8 fL (ref 7.5–12.5)
Monocytes Absolute: 190 cells/uL — ABNORMAL LOW (ref 200–950)
NEUTROS PCT: 49 %
Neutro Abs: 1862 cells/uL (ref 1500–7800)
PLATELETS: 217 10*3/uL (ref 140–400)
RBC: 4.78 MIL/uL (ref 3.80–5.10)
RDW: 14.5 % (ref 11.0–15.0)
WBC: 3.8 10*3/uL (ref 3.8–10.8)

## 2016-07-28 LAB — COMPLETE METABOLIC PANEL WITH GFR
ALT: 13 U/L (ref 6–29)
AST: 15 U/L (ref 10–35)
Albumin: 3.5 g/dL — ABNORMAL LOW (ref 3.6–5.1)
Alkaline Phosphatase: 96 U/L (ref 33–130)
BUN: 11 mg/dL (ref 7–25)
CHLORIDE: 108 mmol/L (ref 98–110)
CO2: 25 mmol/L (ref 20–31)
Calcium: 8.9 mg/dL (ref 8.6–10.4)
Creat: 0.89 mg/dL (ref 0.50–1.05)
GFR, Est African American: 84 mL/min (ref >=60)
GFR, Est Non African American: 73 mL/min (ref >=60)
Glucose, Bld: 94 mg/dL (ref 65–99)
POTASSIUM: 4.2 mmol/L (ref 3.5–5.3)
SODIUM: 141 mmol/L (ref 135–146)
Total Bilirubin: 0.4 mg/dL (ref 0.2–1.2)
Total Protein: 6.2 g/dL (ref 6.1–8.1)

## 2016-07-28 LAB — LIPID PANEL
Cholesterol: 214 mg/dL — ABNORMAL HIGH (ref <200)
HDL: 69 mg/dL (ref >50)
LDL Cholesterol: 133 mg/dL — ABNORMAL HIGH (ref <100)
Total CHOL/HDL Ratio: 3.1 ratio (ref <5.0)
Triglycerides: 60 mg/dL (ref <150)
VLDL: 12 mg/dL (ref <30)

## 2016-07-28 LAB — TSH: TSH: 1.14 m[IU]/L

## 2016-07-31 ENCOUNTER — Encounter: Payer: Self-pay | Admitting: Internal Medicine

## 2016-07-31 ENCOUNTER — Ambulatory Visit (INDEPENDENT_AMBULATORY_CARE_PROVIDER_SITE_OTHER): Payer: 59 | Admitting: Internal Medicine

## 2016-07-31 VITALS — BP 130/82 | HR 83 | Temp 97.6°F | Ht 65.0 in | Wt 186.0 lb

## 2016-07-31 DIAGNOSIS — E6609 Other obesity due to excess calories: Secondary | ICD-10-CM | POA: Diagnosis not present

## 2016-07-31 DIAGNOSIS — G4700 Insomnia, unspecified: Secondary | ICD-10-CM

## 2016-07-31 DIAGNOSIS — B359 Dermatophytosis, unspecified: Secondary | ICD-10-CM | POA: Diagnosis not present

## 2016-07-31 DIAGNOSIS — G8929 Other chronic pain: Secondary | ICD-10-CM | POA: Diagnosis not present

## 2016-07-31 DIAGNOSIS — I10 Essential (primary) hypertension: Secondary | ICD-10-CM

## 2016-07-31 DIAGNOSIS — Z23 Encounter for immunization: Secondary | ICD-10-CM

## 2016-07-31 DIAGNOSIS — M5441 Lumbago with sciatica, right side: Secondary | ICD-10-CM | POA: Diagnosis not present

## 2016-07-31 DIAGNOSIS — Z6831 Body mass index (BMI) 31.0-31.9, adult: Secondary | ICD-10-CM | POA: Diagnosis not present

## 2016-07-31 MED ORDER — NAFTIFINE HCL 2 % EX CREA: TOPICAL_CREAM | CUTANEOUS | 5 refills | Status: AC

## 2016-07-31 MED ORDER — PHENTERMINE HCL 37.5 MG PO CAPS: 37.5000 mg | ORAL_CAPSULE | ORAL | 4 refills | Status: DC

## 2016-07-31 MED ORDER — VALSARTAN-HYDROCHLOROTHIAZIDE 80-12.5 MG PO TABS: 1.0000 | ORAL_TABLET | Freq: Every day | ORAL | 5 refills | Status: DC

## 2016-07-31 NOTE — Patient Instructions (Signed)
Tdap given today  Continue current medications as ordered  Resume diet/exercise program  Follow up in 3 mos for obesity, HTN
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
## 2016-08-27 ENCOUNTER — Other Ambulatory Visit: Payer: Self-pay | Admitting: Internal Medicine

## 2016-08-27 DIAGNOSIS — E6609 Other obesity due to excess calories: Secondary | ICD-10-CM

## 2016-08-27 DIAGNOSIS — Z6831 Body mass index (BMI) 31.0-31.9, adult: Principal | ICD-10-CM

## 2016-08-29 ENCOUNTER — Other Ambulatory Visit: Payer: Self-pay | Admitting: Internal Medicine

## 2016-09-09 ENCOUNTER — Telehealth: Payer: Self-pay | Admitting: *Deleted

## 2016-09-09 NOTE — Telephone Encounter (Signed)
Patient called back and stated that she is having depression. Stated that the Trazodone is not working anymore. Stated that she wants to cry all the time and she feels like if she lays down and not wake up it is ok with her.  I offered patient an appointment with you for Friday but patient does not want to come in she stated that she only wants her medication changed to something different. Feels like her Trazodone is not working anymore.  Please advise.

## 2016-09-09 NOTE — Telephone Encounter (Signed)
Appointment scheduled for July 31. Patient agreed.

## 2016-09-09 NOTE — Telephone Encounter (Signed)
Patient called and left message on Clinical Intake Line stating that she was having depression and wanted a medication for it.  Tried calling patient back to get more information and to schedule an appointment [facility] to return call.

## 2016-09-09 NOTE — Telephone Encounter (Signed)
She needs to be seen prior to any change in her medication

## 2016-09-09 NOTE — Telephone Encounter (Signed)
LMOM to return call.

## 2016-09-15 ENCOUNTER — Ambulatory Visit: Payer: Self-pay | Admitting: Internal Medicine

## 2016-10-01 ENCOUNTER — Other Ambulatory Visit: Payer: Self-pay | Admitting: Internal Medicine

## 2016-10-30 ENCOUNTER — Other Ambulatory Visit: Payer: Self-pay | Admitting: Internal Medicine

## 2016-11-03 ENCOUNTER — Ambulatory Visit (INDEPENDENT_AMBULATORY_CARE_PROVIDER_SITE_OTHER): Payer: 59 | Admitting: Internal Medicine

## 2016-11-03 ENCOUNTER — Encounter: Payer: Self-pay | Admitting: Internal Medicine

## 2016-11-03 VITALS — BP 134/88 | HR 68 | Temp 98.2°F | Ht 65.0 in | Wt 195.4 lb

## 2016-11-03 DIAGNOSIS — R140 Abdominal distension (gaseous): Secondary | ICD-10-CM | POA: Diagnosis not present

## 2016-11-03 DIAGNOSIS — R198 Other specified symptoms and signs involving the digestive system and abdomen: Secondary | ICD-10-CM

## 2016-11-03 DIAGNOSIS — I10 Essential (primary) hypertension: Secondary | ICD-10-CM

## 2016-11-03 DIAGNOSIS — F325 Major depressive disorder, single episode, in full remission: Secondary | ICD-10-CM

## 2016-11-03 DIAGNOSIS — R635 Abnormal weight gain: Secondary | ICD-10-CM | POA: Diagnosis not present

## 2016-11-03 LAB — COMPLETE METABOLIC PANEL WITH GFR
AG RATIO: 1.7 (calc) (ref 1.0–2.5)
ALKALINE PHOSPHATASE (APISO): 97 U/L (ref 33–130)
ALT: 16 U/L (ref 6–29)
AST: 19 U/L (ref 10–35)
Albumin: 4.1 g/dL (ref 3.6–5.1)
BILIRUBIN TOTAL: 0.3 mg/dL (ref 0.2–1.2)
BUN/Creatinine Ratio: 14 (calc) (ref 6–22)
BUN: 16 mg/dL (ref 7–25)
CALCIUM: 9.3 mg/dL (ref 8.6–10.4)
CHLORIDE: 102 mmol/L (ref 98–110)
CO2: 26 mmol/L (ref 20–32)
Creat: 1.14 mg/dL — ABNORMAL HIGH (ref 0.50–1.05)
GFR, Est African American: 63 mL/min/{1.73_m2} (ref >=60)
GFR, Est Non African American: 54 mL/min/{1.73_m2} — ABNORMAL LOW (ref >=60)
Globulin: 2.4 g/dL (calc) (ref 1.9–3.7)
Glucose, Bld: 88 mg/dL (ref 65–139)
POTASSIUM: 4.2 mmol/L (ref 3.5–5.3)
Sodium: 137 mmol/L (ref 135–146)
Total Protein: 6.5 g/dL (ref 6.1–8.1)

## 2016-11-03 LAB — TSH: TSH: 1.94 m[IU]/L

## 2016-11-03 LAB — T4, FREE: FREE T4: 1.1 ng/dL (ref 0.8–1.8)

## 2016-11-03 MED ORDER — FLUOXETINE HCL 20 MG PO CAPS: 20.0000 mg | ORAL_CAPSULE | Freq: Every day | ORAL | 3 refills | Status: DC

## 2016-11-03 NOTE — Progress Notes (Signed)
Patient ID: Angela Frost, female   DOB: 06-01-1960, 56 y.o.   MRN: [id number]    Location:  [facility] Place of Service: OFFICE  Chief Complaint  Patient presents with  . Medical Management of Chronic Issues    Medical Management of HTN and weight    HPI:  56 yo female seen today for f/u. She reports feeling constant depression and feels "sad all the time". She easily cries at the drop of a hat. No major life changes. She did have a close work Social worker of >20 yrs that expired in July but her depression was worse after his death. She has anhedonia and states "I hate it when my husband touches and me" and she has no desire for sex. She has SI and HI but no specific plans and states some times when she has sex, "I just want to stab him". She has trouble staying asleep. No N/V. Dizziness improved. She declines psych eval and has no plans to seek counseling. She started taking OTC Amberen for hotflashes and notes she could at least "smile again". She is really c/a her weight and inability to lose. She found no improvement in wt after taking phentermine this time. She increased exercise to 4 times per week and cut out bread, crackers and "anything white". She even tried "not eating anything". Increasing paxil dose to 75m has not had any relief of depression. Her hot flashes have worsened. She had to reschedule her appt with me in July due to severely late arrival. She is worried as she has increased abdominal girth. She has hx TAH but ovaries intact.  Labs in June showed no significant abnormalities.  Past Medical History:  Diagnosis Date  . Hot flashes   . Hypertension   . Overweight     Past Surgical History:  Procedure Laterality Date  . ABDOMINAL HYSTERECTOMY  07/18/2011   Dr.Mattern   . COLONOSCOPY  2016   Patient Reported     Patient Care Team: CGildardo Cranker DO as PCP - General (Internal Medicine)  Social History   Social History  . Marital status: Married    Spouse name: N/A  .  Number of children: N/A  . Years of education: N/A   Occupational History  . Not on file.   Social History Main Topics  . Smoking status: Never Smoker  . Smokeless tobacco: Never Used  . Alcohol use No  . Drug use: No  . Sexual activity: Yes   Other Topics Concern  . Not on file   Social History Narrative   Diet: None   Caffeine: Yes   Married: Yes, 2001   House: 2 stories, 2 persons   Pets: No   Current/Past profession: None listed    Exercise: Yes   Living Will: Yes   DNR: N/A   POA/HPOA: N/A           reports that she has never smoked. She has never used smokeless tobacco. She reports that she does not drink alcohol or use drugs.  Family History  Problem Relation Age of Onset  . High blood pressure Father   . Heart disease Mother   . Diabetes Sister   . Obesity Sister   . Diabetes Brother   . Arthritis Brother   . High blood pressure Sister   . High blood pressure Sister   . High blood pressure Brother    Family Status  Relation Status  . Father Alive  . Mother Deceased at age 56 .  Sister Alive  . Sister Alive  . Brother Alive  . Sister Alive  . Sister Alive  . Brother Alive  . Daughter Alive  . Daughter Alive     Allergies  Allergen Reactions  . Estradiol Itching    Medications: Patient's Medications  New Prescriptions   No medications on file  Previous Medications   NAFTIFINE HCL 2 % CREA    Apply twice a day   PAROXETINE (PAXIL) 10 MG TABLET    Take 1 tablet (10 mg total) by mouth daily.   PHENTERMINE 37.5 MG CAPSULE    TAKE 1 CAPSULE BY MOUTH ONCE DAILY IN THE MORNING   ROPINIROLE (REQUIP) 0.25 MG TABLET    Take one tablet by mouth at bedtime for 3 days then increase to 2 tablets by mouth at bedtime   TIZANIDINE (ZANAFLEX) 4 MG TABLET    Take 1 tablet (4 mg total) by mouth every 8 (eight) hours as needed for muscle spasms.   TRAZODONE (DESYREL) 50 MG TABLET    TAKE 2 TABLETS BY MOUTH EVERY NIGHT AT BEDTIME    VALSARTAN-HYDROCHLOROTHIAZIDE (DIOVAN-HCT) 80-12.5 MG TABLET    Take 1 tablet by mouth daily.  Modified Medications   No medications on file  Discontinued Medications   No medications on file    Review of Systems  Psychiatric/Behavioral: Positive for dysphoric mood, sleep disturbance and suicidal ideas. Negative for hallucinations.  All other systems reviewed and are negative.   Vitals:   11/03/16 1435  BP: 134/88  Pulse: 68  Temp: 98.2 F (36.8 C)  TempSrc: Oral  Weight: 195 lb 6.4 oz (88.6 kg)  Height: 5' 5"  (1.651 m)   Body mass index is 32.52 kg/m.  Physical Exam  Constitutional: She is oriented to person, place, and time. She appears well-developed and well-nourished.  HENT:  Right Ear: External ear normal.  Mouth/Throat: Oropharynx is clear and moist. No oropharyngeal exudate.  MMM; no oral thrush  Eyes: Pupils are equal, round, and reactive to light. No scleral icterus.  Neck: Neck supple. Carotid bruit is not present. No tracheal deviation present. No thyromegaly present.  Cardiovascular: Normal rate, regular rhythm, normal heart sounds and intact distal pulses.  Exam reveals no gallop and no friction rub.   No murmur heard. No LE edema b/l. no calf TTP.   Pulmonary/Chest: Effort normal and breath sounds normal. No stridor. No respiratory distress. She has no wheezes. She has no rales.  Abdominal: Soft. Normal appearance and bowel sounds are normal. She exhibits distension. She exhibits no mass. There is no hepatosplenomegaly. There is no tenderness. There is no rigidity, no rebound and no guarding. No hernia.  Increased abdominal girth  Lymphadenopathy:    She has no cervical adenopathy.  Neurological: She is alert and oriented to person, place, and time. She has normal reflexes.  Skin: Skin is warm and dry. No rash noted.  Psychiatric: She has a normal mood and affect. Her behavior is normal. Judgment and thought content normal.     Labs reviewed: No visits  with results within 3 Month(s) from this visit.  Latest known visit with results is:  Office Visit on 07/31/2016  Component Date Value Ref Range Status  . HM Pap smear 11/17/2015 Normal, 2 cyst, GYN completed    Final    No results found.   Assessment/Plan   ICD-10-CM   1. 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 Weight gain R63.5 TSH    T4, Free    CT Abdomen Pelvis Wo Contrast  3. Increased abdominal girth R19.8 CMP with eGFR    CT Abdomen Pelvis Wo Contrast  4. Essential hypertension I10 CMP with eGFR  5. Abdominal distension (gaseous) R14.0 CT Abdomen Pelvis Wo Contrast   STOP PAROXETINE due to weight gain  START FLUOXETINE DAILY  Continue other medications as ordered  Go to the ER if your symptoms of depression worsens   Will call with lab and imaging results   Highly  recommend you seek counseling for your depression  Follow up in 4 weeks for depression   Mattis Featherly S. Perlie Gold  [facility] [address] [place], [place] [address] [phone] Cell (Monday-Friday 8 AM - 5 PM) [phone] After 5 PM and follow prompts

## 2016-11-03 NOTE — Patient Instructions (Signed)
STOP PAROXETINE   START FLUOXETINE DAILY  Continue other medications as ordered  Go to the ER if your symptoms of depression worsens   Will call with lab and imaging results   Highly  recommend you seek counseling for your depression  Follow up in 4 weeks for depression

## 2016-11-05 ENCOUNTER — Telehealth: Payer: Self-pay

## 2016-11-05 DIAGNOSIS — E039 Hypothyroidism, unspecified: Secondary | ICD-10-CM

## 2016-11-05 DIAGNOSIS — E038 Other specified hypothyroidism: Secondary | ICD-10-CM

## 2016-11-05 MED ORDER — LEVOTHYROXINE SODIUM 25 MCG PO TABS: 25.0000 ug | ORAL_TABLET | Freq: Every day | ORAL | 3 refills | Status: DC

## 2016-11-05 NOTE — Telephone Encounter (Signed)
-----   Message from [place], [place] sent at 11/04/2016  8:26 AM EDT ----- Normal thyroid function but with inability to lose weight and increased depression, possible subclinical hypothyroidism - rx levothyroxine 25 mcg #30 take 1 tab po daily with 3 RF; reck TSH in 4 weeks; await CT

## 2016-11-05 NOTE — Telephone Encounter (Signed)
Discussed results with patient, patient verbalized understanding of results  Levothyroxine rx sent to pharmacy. Mailed appointment information to recheck TSH in 4 weeks

## 2016-11-18 ENCOUNTER — Ambulatory Visit
Admission: RE | Admit: 2016-11-18 | Discharge: 2016-11-18 | Disposition: A | Discharge status: Home / Self Care | Payer: 59 | Source: Ambulatory Visit | Attending: Internal Medicine | Admitting: Internal Medicine

## 2016-11-18 DIAGNOSIS — R198 Other specified symptoms and signs involving the digestive system and abdomen: Secondary | ICD-10-CM

## 2016-11-18 DIAGNOSIS — R635 Abnormal weight gain: Secondary | ICD-10-CM

## 2016-11-18 DIAGNOSIS — R140 Abdominal distension (gaseous): Secondary | ICD-10-CM

## 2016-11-18 IMAGING — CT CT ABD-PELV W/ CM
1 of 3 series · 13 of 32 positions shown, 18 images · IV contrast (APPLIED)
Comparison: None.

CLINICAL DATA: Abdominal distention and firmness for the past 2
months with weight gain of approximately 12 - 14 pounds.

EXAM:
CT ABDOMEN AND PELVIS WITH CONTRAST
TECHNIQUE: Multidetector CT imaging of the abdomen and pelvis was performed
using the standard protocol following bolus administration of
intravenous contrast.
CONTRAST:  100mL B81YYM-500 IOPAMIDOL (B81YYM-500) INJECTION 61%

[Series 2: abd/pelvis w/cm · axial · 0.90mm/px · z∈[-687,-282]mm · 13 of 91 slices shown, 18 images]
[im 5/91  soft-tissue]
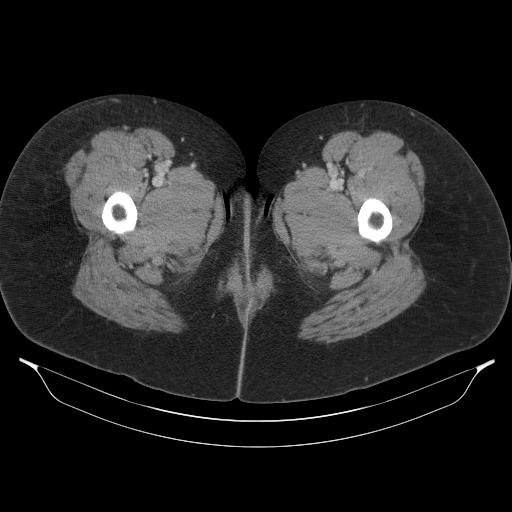
[im 5/91  bone]
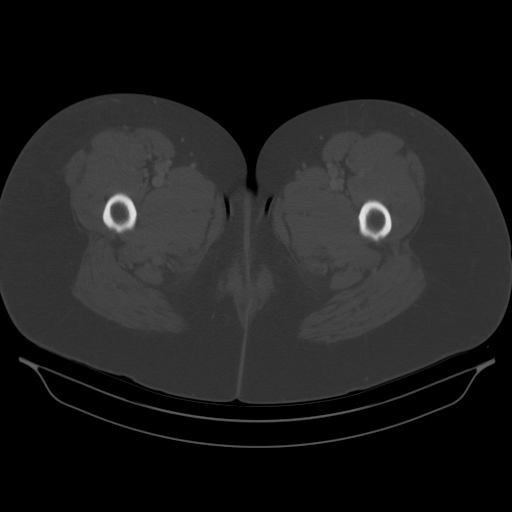
[im 15/91  soft-tissue]
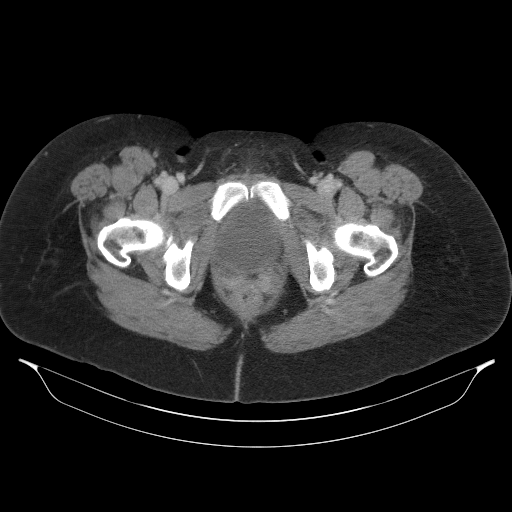
[im 19/91  soft-tissue]
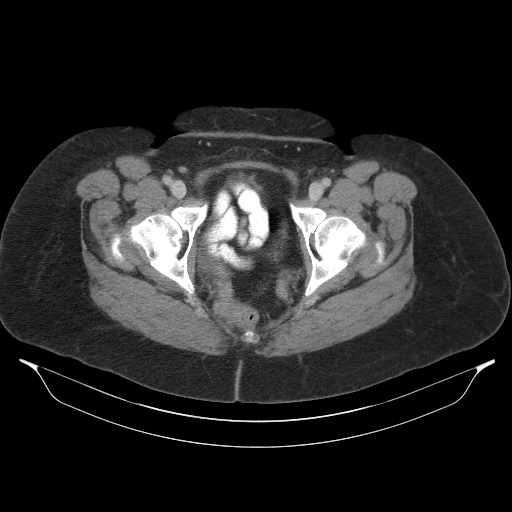
[im 29/91  soft-tissue]
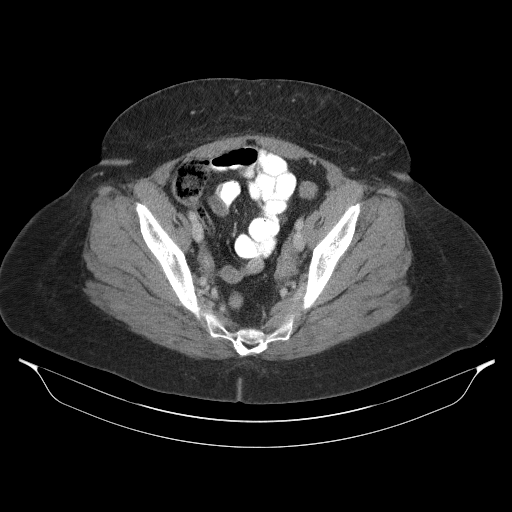
[im 34/91  soft-tissue]
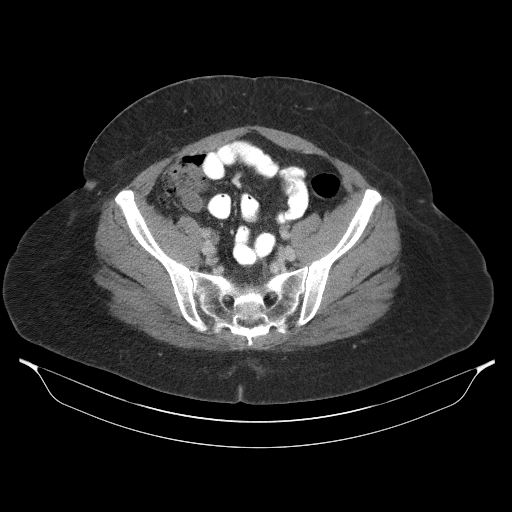
[im 43/91  soft-tissue]
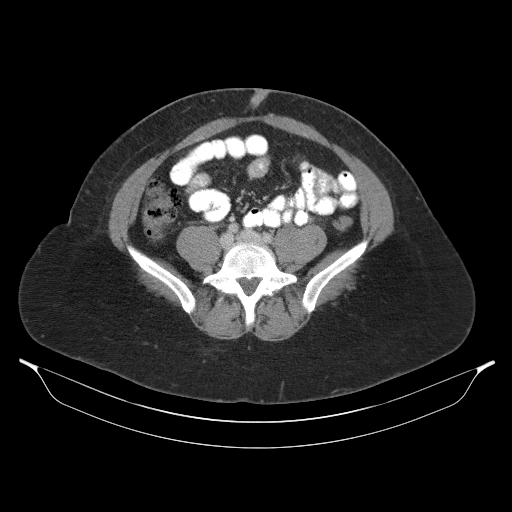
[im 48/91  soft-tissue]
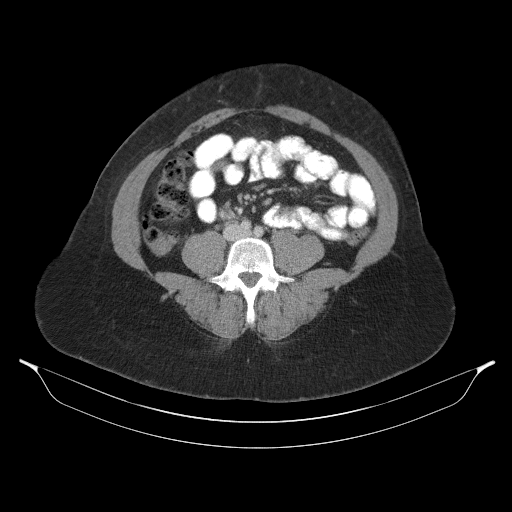
[im 57/91  soft-tissue]
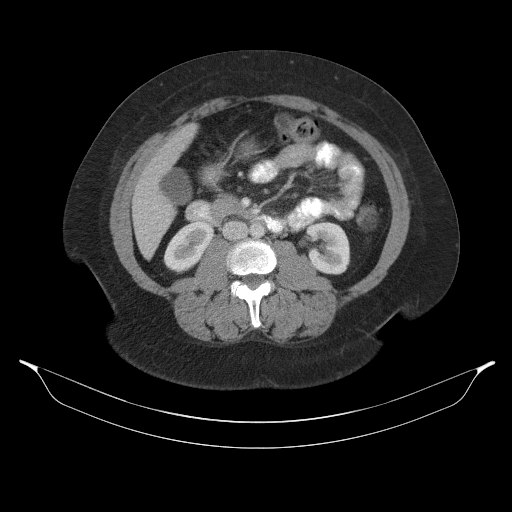
[im 62/91  soft-tissue]
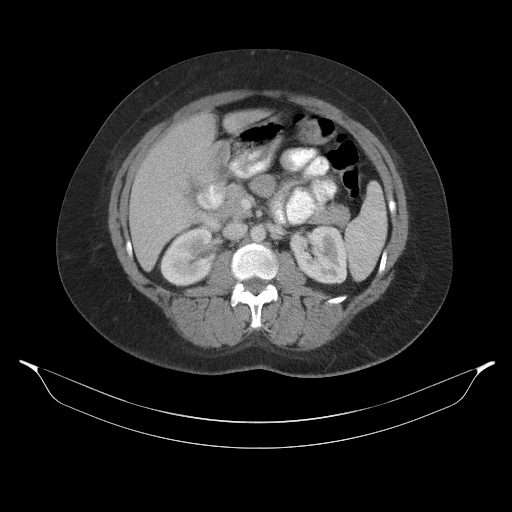
[im 62/91  bone]
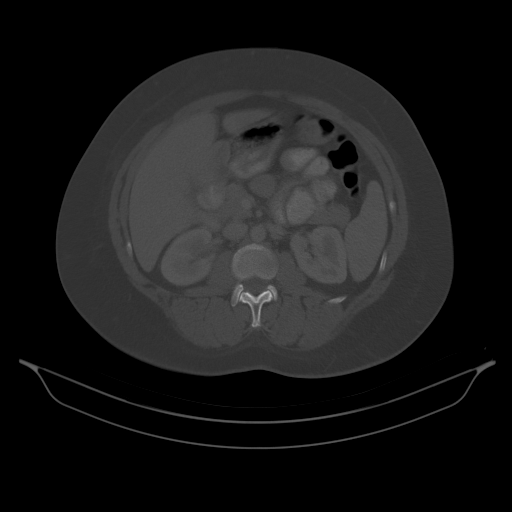
[im 72/91  soft-tissue]
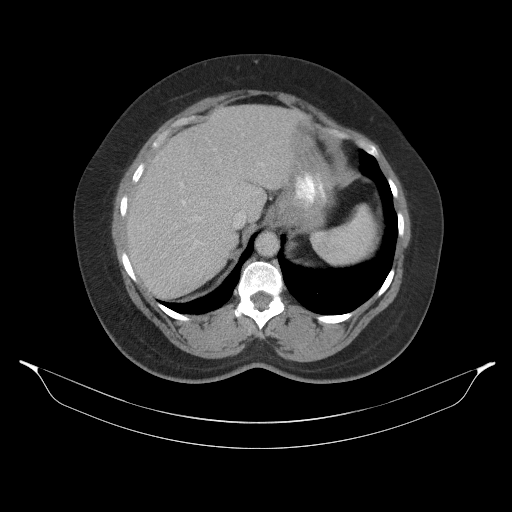
[im 72/91  lung]
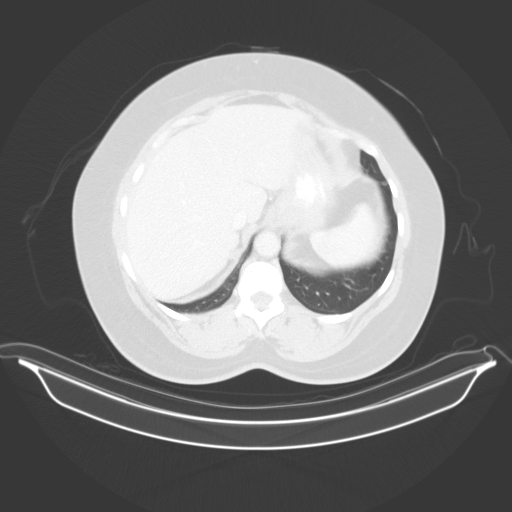
[im 76/91  soft-tissue]
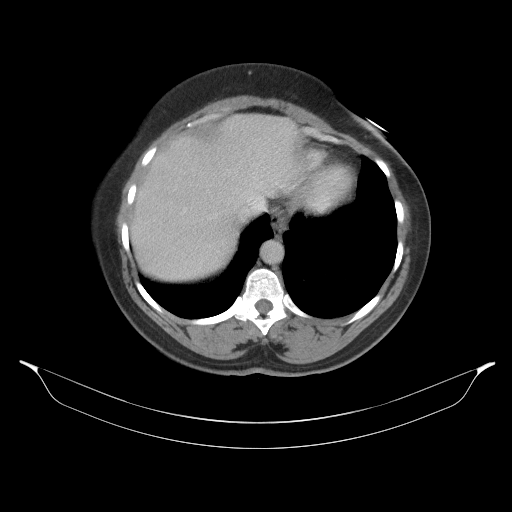
[im 76/91  lung]
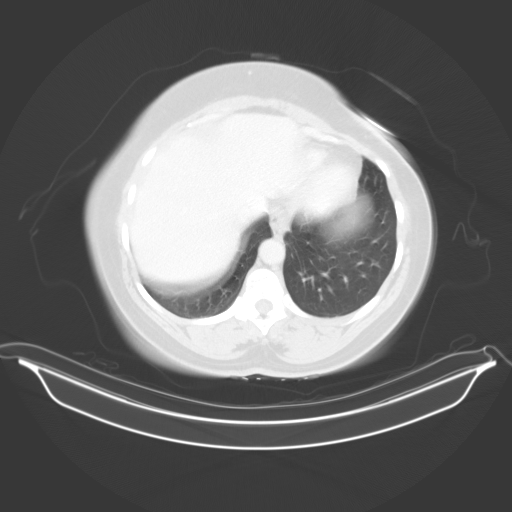
[im 81/91  lung]
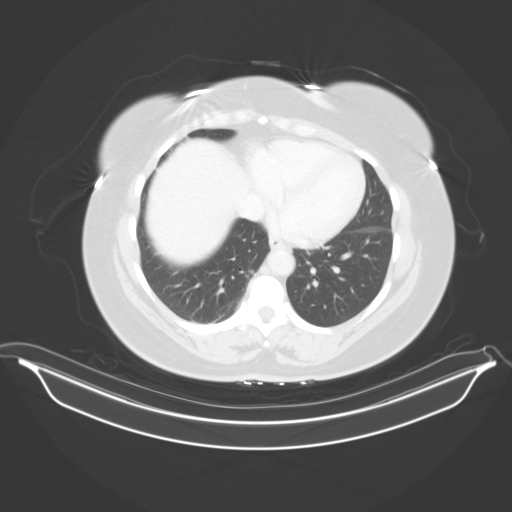
[im 86/91  soft-tissue]
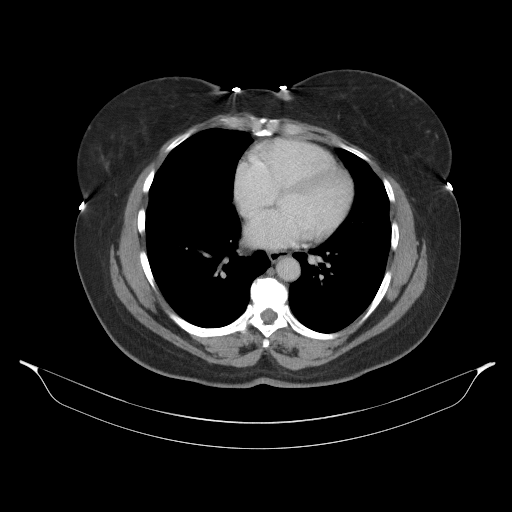
[im 86/91  lung]
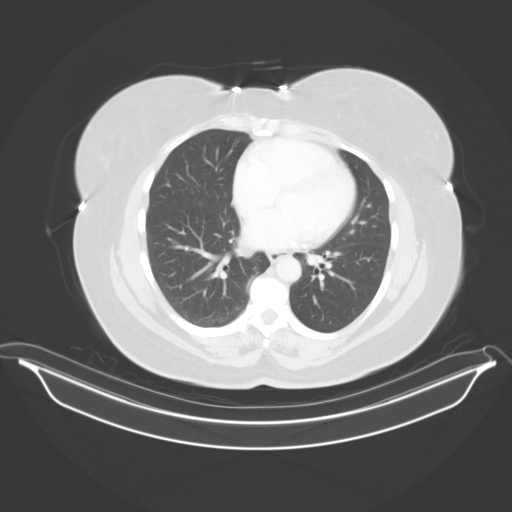

[13 of 32 positions shown; findings below may reference images not displayed]

FINDINGS: Lower chest: Limited visualization of lower thorax demonstrates
minimal subsegmental atelectasis within the left costophrenic angle
and medial basilar aspect the right lower lobe. Note is made of a
punctate (approximately 0.4 cm) subpleural nodule within the imaged
left lower lobe (image 13, series 5).

Normal heart size.  No pericardial effusion.

Hepatobiliary: Normal hepatic contour. Punctate (approximately
cm) lesion within the cranial most aspect of the caudate (image 13,
series 2) is too small to adequately characterize though favored to
represent hepatic cysts. Normal appearance of the gallbladder given
degree distention. No radiopaque gallstones. No intra extrahepatic
bili duct dilatation. No ascites.

Pancreas: Normal appearance of the pancreas

Spleen: Normal appearance of the spleen

Adrenals/Urinary Tract: There is symmetric enhancement and excretion
of the bilateral kidneys. No definite renal stones this postcontrast
examination. No discrete renal lesions. No urine obstruction or
perinephric stranding.

Normal appearance the bilateral adrenal glands.

Normal appearance of the urinary bladder given degree distention.

Stomach/Bowel: Scattered colonic diverticulosis without evidence of
diverticulitis. Moderate colonic stool burden without evidence of
enteric obstruction. Normal appearance of the terminal ileum and
retrocecal appendix. No pneumoperitoneum, pneumatosis or portal
venous gas

Vascular/Lymphatic: Normal caliber the abdominal aorta. The major
branch vessels of the abdominal aorta appear patent on this non CTA
examination. No bulky retroperitoneal, mesenteric, pelvic or
inguinal lymphadenopathy.

Reproductive: Post cholecystectomy. Note is made of an approximately
2.6 x 2.3 cm hypoattenuating presumably physiologic left-sided
adnexal cyst. No discrete right-sided adnexal lesions. No free fluid
in the pelvic cul-de-sac.

Other: Regional soft tissues appear normal.

Musculoskeletal: No acute or aggressive osseous abnormalities.
Stigmata of DISH with the caudal aspect of the thoracic spine.
Bilateral lower lumbar spine facet degenerative change
IMPRESSION: 1. No explanation for patient's abdominal distention, firmness and
unintentional weight gain.
2. Colonic diverticulosis without evidence of diverticulitis.
3. Incidentally noted approximately 2.6 cm left-sided presumably
physiologic adnexal cyst.
4. Incidentally noted punctate (approximately 0.4 cm) left lower
lobe pulmonary nodule. No follow-up needed if patient is low-risk.
Non-contrast chest CT can be considered in 12 months if patient is
high-risk. This recommendation follows the consensus statement:
Guidelines for Management of Incidental Pulmonary Nodules Detected

## 2016-11-18 MED ORDER — IOPAMIDOL (ISOVUE-300) INJECTION 61%
100.0000 mL | Freq: Once | INTRAVENOUS | Status: AC | PRN
  Administered 2016-11-18: 100 mL via INTRAVENOUS

## 2016-11-29 ENCOUNTER — Encounter (HOSPITAL_BASED_OUTPATIENT_CLINIC_OR_DEPARTMENT_OTHER): Payer: Self-pay | Admitting: Emergency Medicine

## 2016-11-29 ENCOUNTER — Emergency Department (HOSPITAL_BASED_OUTPATIENT_CLINIC_OR_DEPARTMENT_OTHER)
Admission: EM | Admit: 2016-11-29 | Discharge: 2016-11-29 | Disposition: A | Discharge status: Home / Self Care | Payer: 59 | Attending: Emergency Medicine | Admitting: Emergency Medicine

## 2016-11-29 DIAGNOSIS — M545 Low back pain: Secondary | ICD-10-CM | POA: Insufficient documentation

## 2016-11-29 DIAGNOSIS — I10 Essential (primary) hypertension: Secondary | ICD-10-CM | POA: Insufficient documentation

## 2016-11-29 DIAGNOSIS — Z79899 Other long term (current) drug therapy: Secondary | ICD-10-CM | POA: Insufficient documentation

## 2016-11-29 HISTORY — DX: Disorder of thyroid, unspecified: E07.9

## 2016-11-29 HISTORY — DX: Major depressive disorder, single episode, unspecified: F32.9

## 2016-11-29 HISTORY — DX: Depression, unspecified: F32.A

## 2016-11-29 MED ORDER — CYCLOBENZAPRINE HCL 5 MG PO TABS: 5.0000 mg | ORAL_TABLET | Freq: Two times a day (BID) | ORAL | 0 refills | Status: DC | PRN

## 2016-11-29 MED ORDER — CYCLOBENZAPRINE HCL 5 MG PO TABS
5.0000 mg | ORAL_TABLET | Freq: Once | ORAL | Status: AC
  Administered 2016-11-29: 5 mg via ORAL
  Filled 2016-11-29: qty 1

## 2016-11-29 MED ORDER — TRAMADOL HCL 50 MG PO TABS: 50.0000 mg | ORAL_TABLET | Freq: Three times a day (TID) | ORAL | 0 refills | Status: DC | PRN

## 2016-11-29 NOTE — ED Triage Notes (Signed)
R sided low back pain radiating into R hip since Thursday. Denies injury.

## 2016-11-29 NOTE — ED Notes (Signed)
Pt states she began having pain to the right lower back on Wed. Yesterday radiated more to hip and down side of leg. Some trouble getting up and down. Denies urinary s/s.

## 2016-11-29 NOTE — ED Notes (Signed)
Pt's FM was out at desk and ready to leave so d/c VS were deferred.

## 2016-11-29 NOTE — Discharge Instructions (Signed)
You were seen here today for Back Pain: Low back pain is discomfort in the lower back that may be due to injuries to muscles and ligaments around the spine. Occasionally, it may be caused by a problem to a part of the spine called a disc. Your back pain should be treated with medicines listed below as well as back exercises and this back pain should get better over the next 2 weeks. Most patients get completely well in 4 weeks. It is important to know however, if you develop severe or worsening pain, low back pain with fever, numbness, weakness or inability to walk or urinate, you should return to the ER immediately.  Please follow up with your doctor this week for a recheck if still having symptoms.  HOME INSTRUCTIONS Self - care:  The application of heat can help soothe the pain.  Maintaining your daily activities, including walking (this is encouraged), as it will help you get better faster than just staying in bed. Do not life, push, pull anything more than 10 pounds for the next week. I am attaching back exercises that you can do at home to help facilitate your recovery.   Back Exercises - I have attached a handout on back exercises that can be done at home to help facilitate your recovery.   Medications are also useful to help with pain control.    Muscle relaxants:  These medications can help with muscle tightness that is a cause of lower back pain.  Most of these medications can cause drowsiness, and it is not safe to drive or use dangerous machinery while taking them. They are primarily helpful when taken at night before sleep.  As you cannot take Tylenol or ibuprofen I am providing you Ultram for breakthrough pain till your surgery. Do not drink alcohol drive or operate heavy machinery when taking. You are being provided a prescription for opiates (also known as narcotics) for pain control on an ?as needed? basis.  Opiates can be addictive and should only be used when absolutely necessary for  pain control when other alternatives do not work.  We recommend you only use them for the recommended amount of time and only as prescribed.  Please do not take with other sedative medications or alcohol.  Please do not drive, operate machinery, or make important decisions while taking opiates.  Please note that these medications can be addictive and have high abuse potential.  Please keep these medications locked away from children, teenagers or any family members with history of substance abuse. Additionally, these medications may cause constipation - take over the counter stool softeners or add fiber to your diet to treat this (Metamucil, Psyllium Fiber, Colace, Miralax) Further refills will need to be obtained from your primary care doctor and will not be prescribed through the Emergency Department. You will test positive on most drug tests while taking this medication.   You will need to follow up with your primary healthcare providerin 1-2 weeks for reassessment and persistent symptoms.  Be aware that if you develop new symptoms, such as a fever, leg weakness, difficulty with or loss of control of your urine or bowels, abdominal pain, or more severe pain, you will need to seek medical attention and/or return to the Emergency department. Additional Information:  Your vital signs today were: BP 140/66 (BP Location: Left Arm)    Pulse 76    Temp 97.9 F (36.6 C) (Oral)    Resp 18    Ht 5\' 3"  (  1.6 m)    Wt 83 kg (183 lb)    SpO2 99%    BMI 32.42 kg/m  If your blood pressure (BP) was elevated above 135/85 this visit, please have this repeated by your doctor within one month. ---------------

## 2016-11-29 NOTE — ED Notes (Signed)
Husband at the nurses station and reports " that this is taking too long" and asking for pain medication advice. The PA to the bedside to talk to patient at this time

## 2016-11-29 NOTE — ED Provider Notes (Signed)
[facility] Provider Note   CSN: [id number] Arrival date & time: 11/29/16  1744     History   Chief Complaint Chief Complaint  Patient presents with  . Back Pain    HPI Angela Frost is a 56 y.o. female with a history of right-sided sciatica back pain who presents emergency department today for right back pain that began on Wednesday. Patient notes recent increase in activity that aggravated her back pain. Back pain is constant, worse with twisting, bending down and when getting up from seated position. Mild radiation into the right hip, but does not extend further into the leg or into the abdomen. She has only been able to use icyhot patches for this as she has surgery on Tuesday for carpal tunnel and was told not to use NSAID's. She is also told not to take Tylenol from her PCP due to other medications (statins) she is on. Denies history of cancer, trauma, fever, night pain, IV drug use, recent spinal manipulation or procedures, upper back pain or neck pain, numbness/tingling/weakness of the lower extremities, urinary retention, loss of bowel/bladder function, saddle anesthesia, or unexplained weight loss.  HPI  Past Medical History:  Diagnosis Date  . Depression   . Hot flashes   . Hypertension   . Overweight   . Thyroid disease     Patient Active Problem List   Diagnosis Date Noted  . Essential hypertension 07/31/2016  . Class 1 obesity due to excess calories without serious comorbidity with body mass index (BMI) of 31.0 to 31.9 in adult 07/31/2016  . 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 Insomnia 07/31/2016  . Tinea 07/31/2016    Past Surgical History:  Procedure Laterality Date  . ABDOMINAL HYSTERECTOMY  07/18/2011   Dr.Mattern   . COLONOSCOPY  2016   Patient Reported     OB History    No data available       Home Medications    Prior to Admission medications   Medication Sig Start Date End Date Taking?  Authorizing Provider  levothyroxine (SYNTHROID, LEVOTHROID) 25 MCG tablet Take 1 tablet (25 mcg total) by mouth daily before breakfast. take it on an empty stomach, at least 30 minutes before breakfast. 11/05/16  Yes Eulas Post, Monica, DO  tiZANidine (ZANAFLEX) 4 MG tablet Take 1 tablet (4 mg total) by mouth every 8 (eight) hours as needed for muscle spasms. 02/21/16  Yes Eulas Post, Brayton Layman, DO  traZODone (DESYREL) 50 MG tablet TAKE 2 TABLETS BY MOUTH EVERY NIGHT AT BEDTIME 10/30/16  Yes Eulas Post, Monica, DO  valsartan-hydrochlorothiazide (DIOVAN-HCT) 80-12.5 MG tablet Take 1 tablet by mouth daily. 07/31/16  Yes Eulas Post, Monica, DO  FLUoxetine (PROZAC) 20 MG capsule Take 1 capsule (20 mg total) by mouth daily. 11/03/16   Gildardo Cranker, DO  Naftifine HCl 2 % CREA Apply twice a day 07/31/16   Gildardo Cranker, DO  phentermine 37.5 MG capsule TAKE 1 CAPSULE BY MOUTH ONCE DAILY IN THE MORNING 08/27/16   Gildardo Cranker, DO  rOPINIRole (REQUIP) 0.25 MG tablet Take one tablet by mouth at bedtime for 3 days then increase to 2 tablets by mouth at bedtime 02/07/16   Gildardo Cranker, DO    Family History Family History  Problem Relation Age of Onset  . High blood pressure Father   . Heart disease Mother   . Diabetes Sister   . Obesity Sister   . Diabetes Brother   . Arthritis Brother   . High blood pressure  Sister   . High blood pressure Sister   . High blood pressure Brother     Social History Social History  Substance Use Topics  . Smoking status: Never Smoker  . Smokeless tobacco: Never Used  . Alcohol use No     Allergies   Estradiol   Review of Systems Review of Systems  All other systems reviewed and are negative.    Physical Exam Updated Vital Signs BP 140/66 (BP Location: Left Arm)   Pulse 76   Temp 97.9 F (36.6 C) (Oral)   Resp 18   Ht 5\' 3"  (1.6 m)   Wt 83 kg (183 lb)   SpO2 99%   BMI 32.42 kg/m   Physical Exam  Constitutional: She appears well-developed and well-nourished. No  distress.  Non-toxic appearing  HENT:  Head: Normocephalic and atraumatic.  Right Ear: External ear normal.  Left Ear: External ear normal.  Neck: Normal range of motion. Neck supple. No spinous process tenderness present. No neck rigidity. Normal range of motion present.  Cardiovascular: Normal rate, regular rhythm, normal heart sounds and intact distal pulses.   No murmur heard. Pulses:      Radial pulses are 2+ on the right side, and 2+ on the left side.       Femoral pulses are 2+ on the right side, and 2+ on the left side.      Dorsalis pedis pulses are 2+ on the right side, and 2+ on the left side.       Posterior tibial pulses are 2+ on the right side, and 2+ on the left side.  Pulmonary/Chest: Effort normal and breath sounds normal. No respiratory distress.  Abdominal: Soft. Bowel sounds are normal. She exhibits no pulsatile midline mass. There is no tenderness. There is no rigidity, no rebound and no CVA tenderness.  Musculoskeletal:       Right hip: She exhibits normal range of motion and normal strength.  Posterior and appearance appears normal. No evidence of obvious scoliosis or kyphosis. No obvious signs of skin changes, trauma, deformity, infection. No C, T, or L spine tenderness or step-offs to palpation. No C, T paraspinal tenderness. Right sided paraspinal TTP. Lung expansion normal. Bilateral lower extremity strength 5 out of 5. Patellar and Achilles deep tendon reflex 2+ and equal bilaterally. Sensation of lower extremities grossly intact. Straight leg right neg. Straight leg left neg. Gait able but patient notes painful. Lower extremity compartments soft. PT and DP 2+ b/l. Cap refill <2 seconds.   Neurological: She is alert.  Speech clear. Follows commands. No facial droop. PERRLA. EOM grossly intact. CN III-XII grossly intact. Grossly moves all extremities 4 without ataxia. Able and appropriate strength for age to upper and lower extremities bilaterally including grip  strength.   Skin: Skin is warm, dry and intact. Capillary refill takes less than 2 seconds. No rash noted. She is not diaphoretic. No erythema.  Nursing note and vitals reviewed.    ED Treatments / Results  Labs (all labs ordered are listed, but only abnormal results are displayed) Labs Reviewed - No data to display  EKG  EKG Interpretation None       Radiology No results found.  Procedures Procedures (including critical care time)  Medications Ordered in ED Medications - No data to display   Initial Impression / Assessment and Plan / ED Course  I have reviewed the triage vital signs and the nursing notes.  Pertinent labs & imaging results that were available during  my care of the patient were reviewed by me and considered in my medical decision making (see chart for details).     Patient with back pain.  No neurological deficits and normal neuro exam.  Patient can walk but states is painful.  No loss of bowel or bladder control.  No concern for cauda equina.  No fever, night sweats, weight loss, h/o cancer, IVDU. Doubt intra-abdominal pathology based on exam. Patient without ability to take NSAIDs or Tylenol due to medication interaction and upcoming surgery in 2 days. Patient reviewed in [place] and feel comfortable giving 2 days worth of pain medication till her surgery. Discussed with patient RICE protocol, back exercises and proper follow up with PCP. Patient in agreement with plan and appears safe for discharge.   Final Clinical Impressions(s) / ED Diagnoses   Final diagnoses:  Acute right-sided low back pain, with sciatica presence unspecified    New Prescriptions New Prescriptions   No medications on file     Lorelle Gibbs 11/30/16 1423    Davonna Belling, MD 11/30/16 2212

## 2016-11-29 NOTE — ED Notes (Signed)
Pt and FM given d/c instructions as per chart. Rx x 2. Verbalizes understanding. No questions. 

## 2016-12-04 ENCOUNTER — Other Ambulatory Visit: Payer: Self-pay | Admitting: Internal Medicine

## 2016-12-11 ENCOUNTER — Other Ambulatory Visit: Payer: 59

## 2016-12-11 DIAGNOSIS — E038 Other specified hypothyroidism: Secondary | ICD-10-CM

## 2016-12-11 DIAGNOSIS — E039 Hypothyroidism, unspecified: Secondary | ICD-10-CM

## 2016-12-11 LAB — TSH: TSH: 0.97 mIU/L (ref 0.40–4.50)

## 2017-01-01 ENCOUNTER — Other Ambulatory Visit: Payer: Self-pay | Admitting: Internal Medicine

## 2017-01-15 ENCOUNTER — Other Ambulatory Visit: Payer: Self-pay | Admitting: Internal Medicine

## 2017-01-15 DIAGNOSIS — R232 Flushing: Secondary | ICD-10-CM

## 2017-01-19 ENCOUNTER — Telehealth: Payer: Self-pay | Admitting: *Deleted

## 2017-01-19 DIAGNOSIS — F325 Major depressive disorder, single episode, in full remission: Secondary | ICD-10-CM

## 2017-01-19 MED ORDER — FLUOXETINE HCL 20 MG PO CAPS: 20.0000 mg | ORAL_CAPSULE | Freq: Every day | ORAL | 3 refills | Status: DC

## 2017-01-19 NOTE — Telephone Encounter (Signed)
Rx faxed to [facility]. Patient notified.   Patient was notified that Rx was faxed to pharmacy and she stated that she DOES NOT want the Fluoxetine, stated that it does not work. She stated that she wants to go back on the Paroxetine. I told her that it was STOPPED on 9/18 OV due to weight gain. She stated that she does not care about the weight gain she wants to go back on it because it helps her with the Night Sweats. Please Advise.

## 2017-01-19 NOTE — Telephone Encounter (Signed)
She is referring to her fluoxetine

## 2017-01-19 NOTE — Telephone Encounter (Signed)
Patient called and stated that she wants a refill on her "Hot Flash/Night Sweat" medication. Patient cannot remember the name of it and states she just needs a Rx sent to pharmacy. Wants it sent to [facility]. Please Advise.

## 2017-01-19 NOTE — Telephone Encounter (Signed)
Ok for paxil at 20mg  daily #30 with 4 RF

## 2017-01-20 MED ORDER — PAROXETINE HCL 20 MG PO TABS: 20.0000 mg | ORAL_TABLET | Freq: Every day | ORAL | 4 refills | Status: DC

## 2017-01-20 NOTE — Telephone Encounter (Signed)
Patient notified and medication list updated.

## 2017-02-18 ENCOUNTER — Other Ambulatory Visit: Payer: Self-pay | Admitting: Internal Medicine

## 2017-02-18 ENCOUNTER — Telehealth: Payer: Self-pay | Admitting: *Deleted

## 2017-02-18 DIAGNOSIS — E6609 Other obesity due to excess calories: Secondary | ICD-10-CM

## 2017-02-18 DIAGNOSIS — Z6831 Body mass index (BMI) 31.0-31.9, adult: Principal | ICD-10-CM

## 2017-02-18 DIAGNOSIS — I10 Essential (primary) hypertension: Secondary | ICD-10-CM

## 2017-02-18 MED ORDER — PHENTERMINE HCL 37.5 MG PO CAPS: ORAL_CAPSULE | ORAL | 0 refills | Status: DC

## 2017-02-18 NOTE — Telephone Encounter (Signed)
Patient called and was wondering if she could take Phentermine 37.5mg  with her current medications. She has not been taking it and would like a refill called to her pharmacy if you approve for her to take it with her current medications. Medication is listed in current med list. Please Advise.

## 2017-02-18 NOTE — Telephone Encounter (Signed)
Patient Notified. Phoned to pharmacy.

## 2017-02-18 NOTE — Telephone Encounter (Signed)
Ok to take phentermine as ordered

## 2017-03-21 ENCOUNTER — Other Ambulatory Visit: Payer: Self-pay | Admitting: Internal Medicine

## 2017-03-21 DIAGNOSIS — I10 Essential (primary) hypertension: Secondary | ICD-10-CM

## 2017-03-22 NOTE — Telephone Encounter (Signed)
Patient overdue for an appointment. I will call patient after 9 am

## 2017-04-17 ENCOUNTER — Other Ambulatory Visit: Payer: Self-pay | Admitting: Internal Medicine

## 2017-04-30 ENCOUNTER — Emergency Department (HOSPITAL_BASED_OUTPATIENT_CLINIC_OR_DEPARTMENT_OTHER): Payer: 59

## 2017-04-30 ENCOUNTER — Other Ambulatory Visit: Payer: Self-pay

## 2017-04-30 ENCOUNTER — Emergency Department (HOSPITAL_BASED_OUTPATIENT_CLINIC_OR_DEPARTMENT_OTHER)
Admission: EM | Admit: 2017-04-30 | Discharge: 2017-04-30 | Disposition: A | Discharge status: Home / Self Care | Payer: 59 | Attending: Emergency Medicine | Admitting: Emergency Medicine

## 2017-04-30 ENCOUNTER — Encounter (HOSPITAL_BASED_OUTPATIENT_CLINIC_OR_DEPARTMENT_OTHER): Payer: Self-pay | Admitting: Emergency Medicine

## 2017-04-30 ENCOUNTER — Telehealth: Payer: Self-pay | Admitting: *Deleted

## 2017-04-30 DIAGNOSIS — R202 Paresthesia of skin: Secondary | ICD-10-CM | POA: Insufficient documentation

## 2017-04-30 DIAGNOSIS — Z79899 Other long term (current) drug therapy: Secondary | ICD-10-CM | POA: Diagnosis not present

## 2017-04-30 DIAGNOSIS — I10 Essential (primary) hypertension: Secondary | ICD-10-CM | POA: Insufficient documentation

## 2017-04-30 LAB — CBC WITH DIFFERENTIAL/PLATELET
BASOS ABS: 0 10*3/uL (ref 0.0–0.1)
Basophils Relative: 0 %
EOS ABS: 0.2 10*3/uL (ref 0.0–0.7)
EOS PCT: 3 %
HCT: 40.3 % (ref 36.0–46.0)
Hemoglobin: 13.9 g/dL (ref 12.0–15.0)
LYMPHS ABS: 2.1 10*3/uL (ref 0.7–4.0)
Lymphocytes Relative: 30 %
MCH: 28 pg (ref 26.0–34.0)
MCHC: 34.5 g/dL (ref 30.0–36.0)
MCV: 81.3 fL (ref 78.0–100.0)
Monocytes Absolute: 0.7 10*3/uL (ref 0.1–1.0)
Monocytes Relative: 10 %
Neutro Abs: 4 10*3/uL (ref 1.7–7.7)
Neutrophils Relative %: 57 %
PLATELETS: 208 10*3/uL (ref 150–400)
RBC: 4.96 MIL/uL (ref 3.87–5.11)
RDW: 14.3 % (ref 11.5–15.5)
WBC: 7 10*3/uL (ref 4.0–10.5)

## 2017-04-30 LAB — BASIC METABOLIC PANEL
Anion gap: 7 (ref 5–15)
BUN: 18 mg/dL (ref 6–20)
CO2: 28 mmol/L (ref 22–32)
CREATININE: 0.9 mg/dL (ref 0.44–1.00)
Calcium: 8.8 mg/dL — ABNORMAL LOW (ref 8.9–10.3)
Chloride: 104 mmol/L (ref 101–111)
GFR calc Af Amer: >60 mL/min (ref >60)
GFR calc non Af Amer: >60 mL/min (ref >60)
Glucose, Bld: 109 mg/dL — ABNORMAL HIGH (ref 65–99)
Potassium: 3.5 mmol/L (ref 3.5–5.1)
SODIUM: 139 mmol/L (ref 135–145)

## 2017-04-30 IMAGING — CT CT HEAD W/O CM
3 series · 16 of 47 positions shown, 19 images · non-contrast
Comparison: None.

CLINICAL DATA: Tingling around the mouth and down right arm x1
week. History of high blood pressure.

EXAM:
CT HEAD WITHOUT CONTRAST
TECHNIQUE: Contiguous axial images were obtained from the base of the skull
through the vertex without intravenous contrast.

[Series 2: head wo · axial · 0.40mm/px · z∈[-426,-286]mm · 10 of 34 slices shown, 13 images]
[im 3/34  brain]
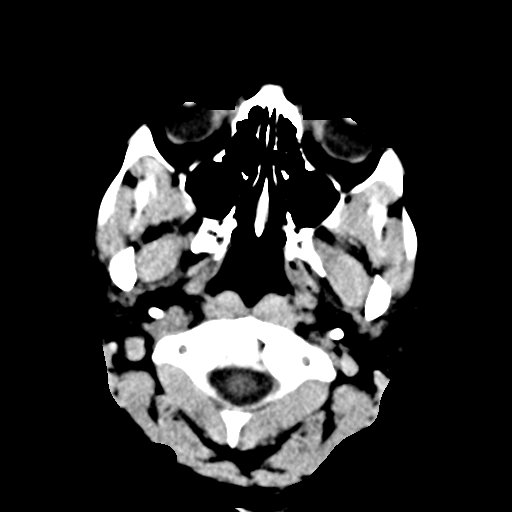
[im 3/34  bone]
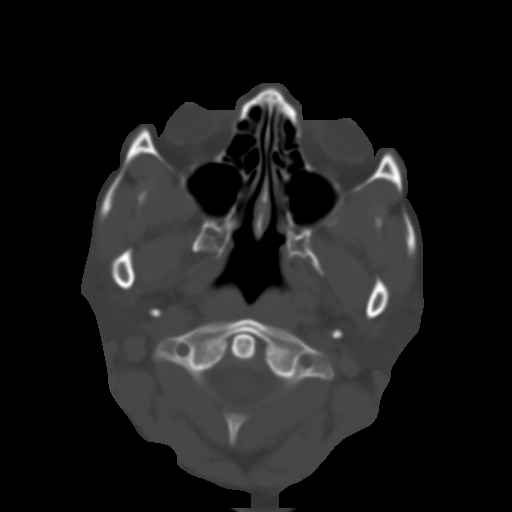
[im 6/34  brain]
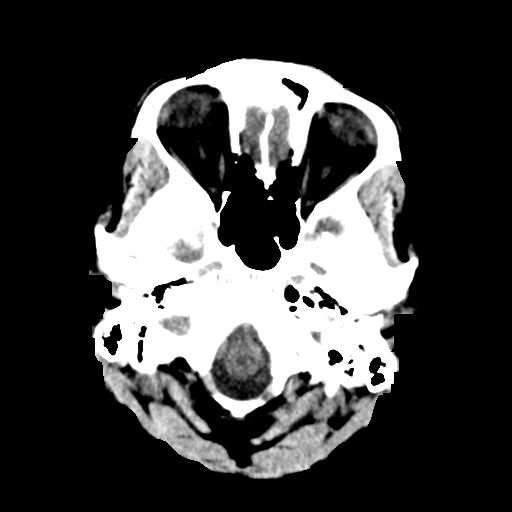
[im 10/34  brain]
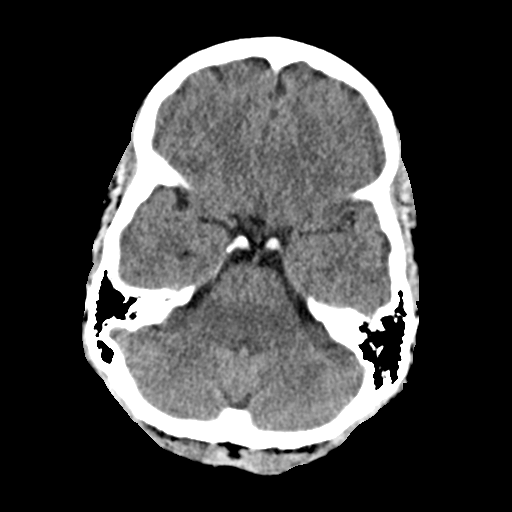
[im 12/34  brain]
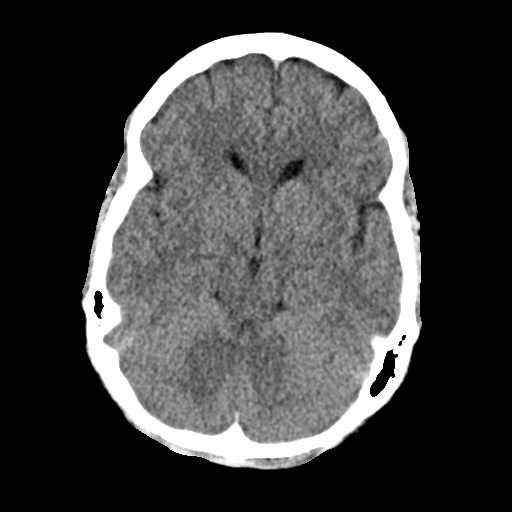
[im 15/34  brain]
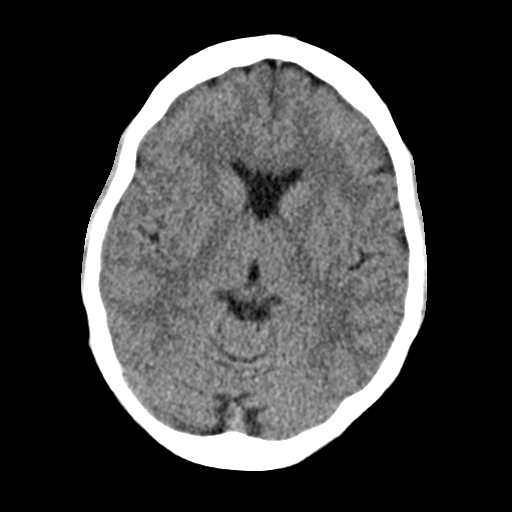
[im 15/34  bone]
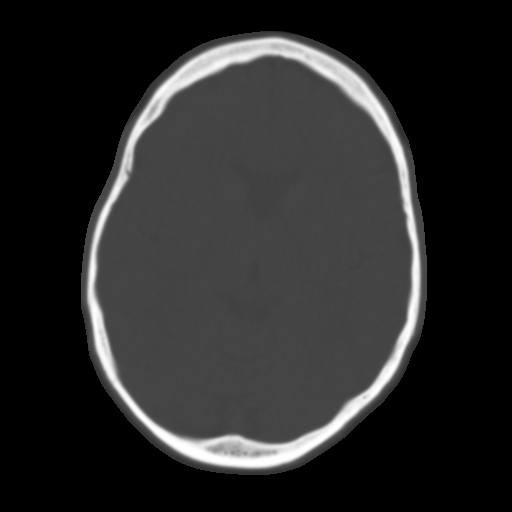
[im 19/34  brain]
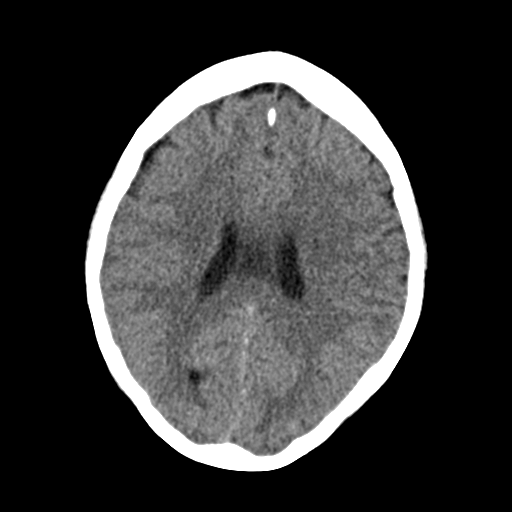
[im 22/34  brain]
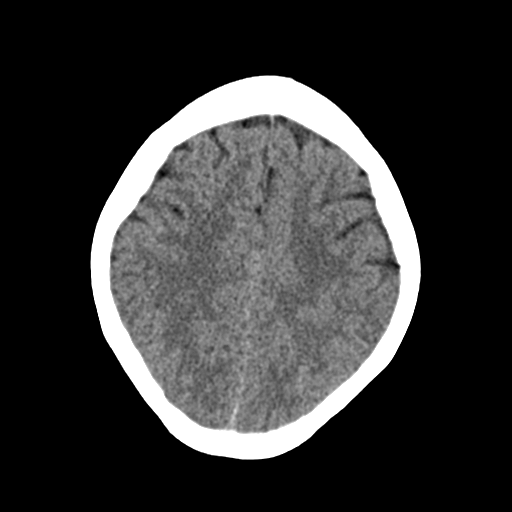
[im 26/34  brain]
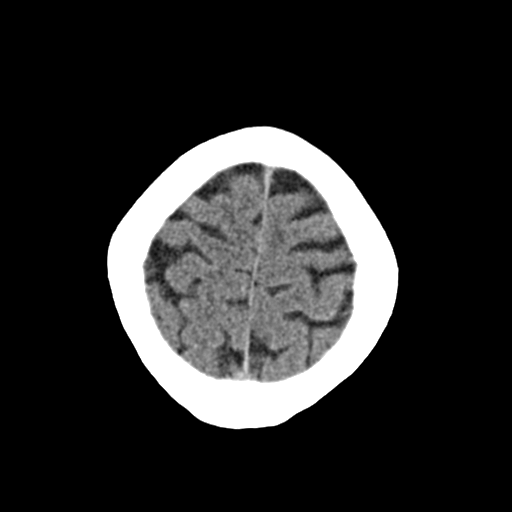
[im 28/34  brain]
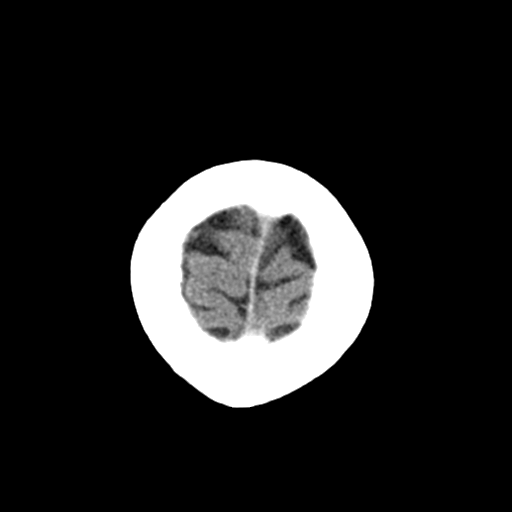
[im 28/34  bone]
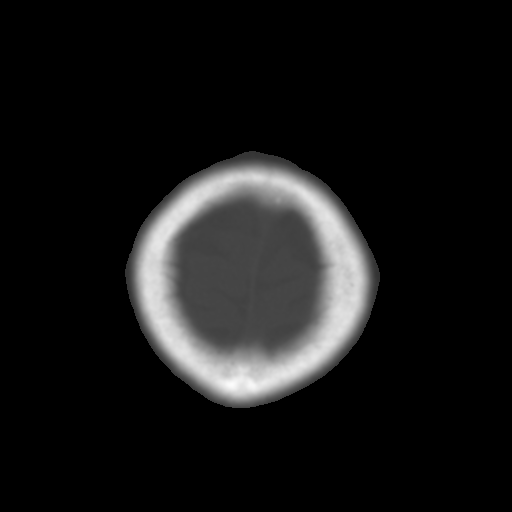
[im 31/34  brain]
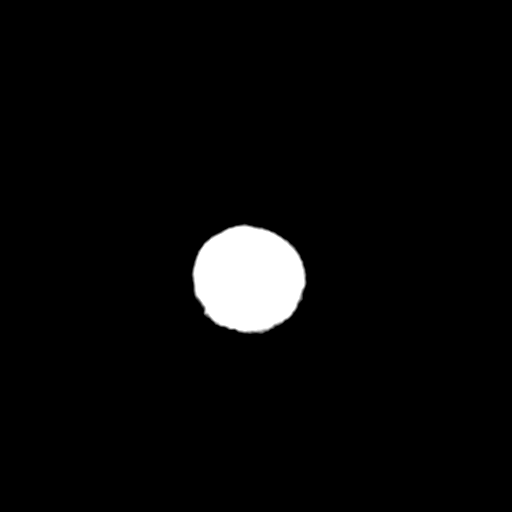

[Series 4: coronal soft · coronal · 0.31mm/px · 3 of 63 slices shown]
[im 21/63  brain]
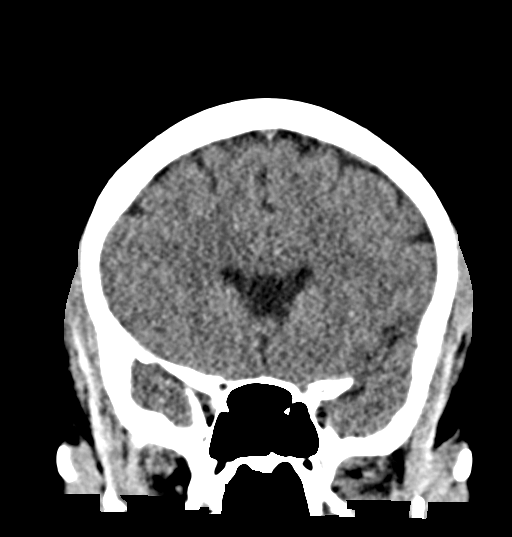
[im 28/63  brain]
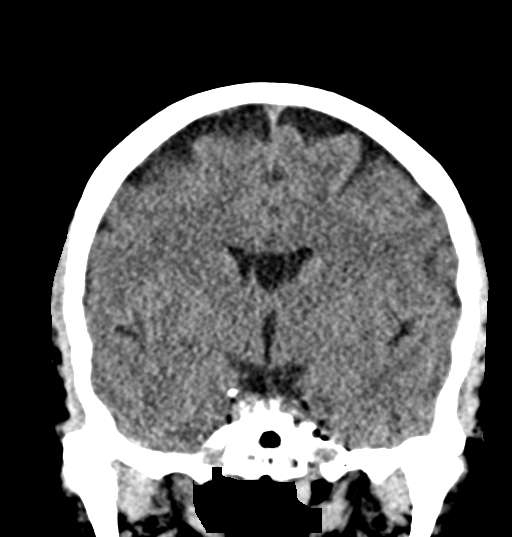
[im 35/63  brain]
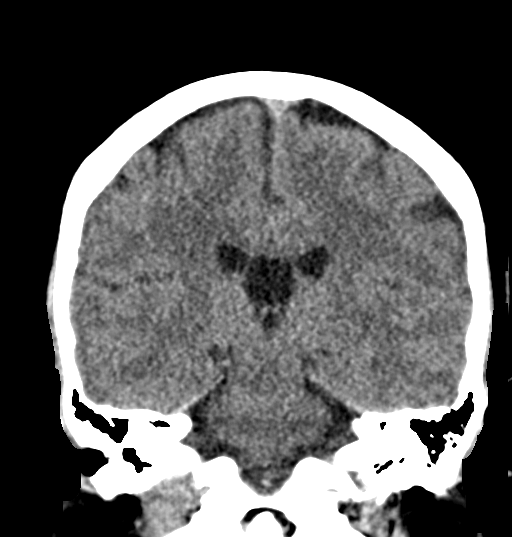

[Series 5: sag soft · sagittal · 0.34mm/px · 3 of 51 slices shown]
[im 17/51  brain]
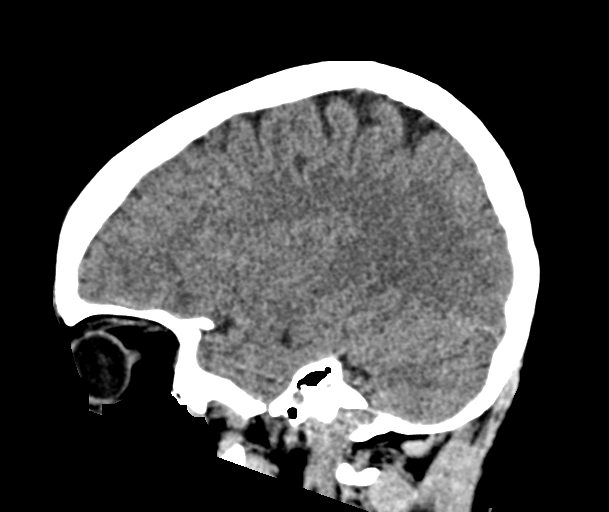
[im 26/51  brain]
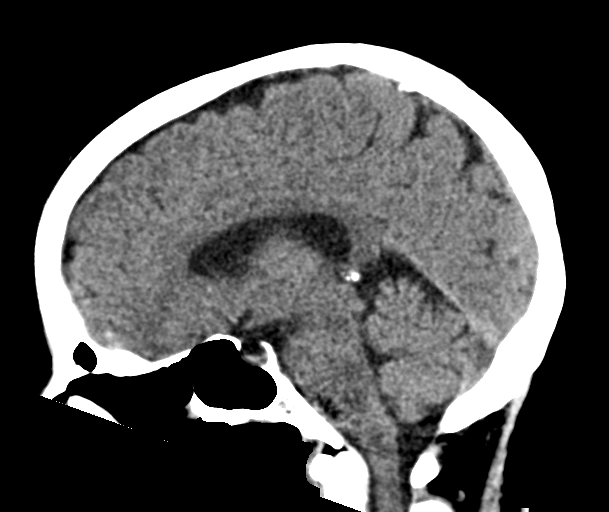
[im 34/51  brain]
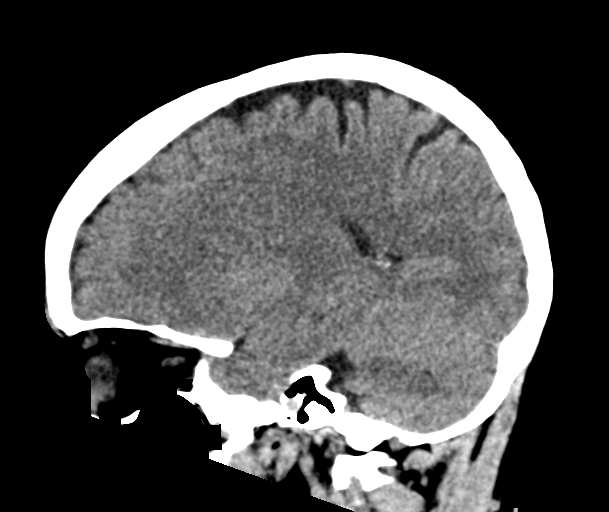

[16 of 47 positions shown; findings below may reference images not displayed]

FINDINGS: Brain: Normal variant cavum septum pellucidum and vergae. Gray-white
matter distinction is maintained. No acute intracranial hemorrhage,
midline shift or edema. No large vascular territory infarct. No
intra-axial mass nor extra-axial collections.

Vascular: No hyperdense vessel sign.

Skull: No acute fracture nor suspicious osseous abnormality

Sinuses/Orbits: Clear paranasal sinuses and mastoids. Intact orbits
and globes.

Other: None
IMPRESSION: No acute intracranial abnormality.

## 2017-04-30 NOTE — Telephone Encounter (Signed)
Patient currently in the ER.

## 2017-04-30 NOTE — ED Notes (Signed)
Pt verbalizes understanding of d/c instructions and denies any further needs at this time. 

## 2017-04-30 NOTE — Telephone Encounter (Signed)
Patient called and left message on Clinical intake stating she was having numbness and tingling down the right side of her body from the face to the feet. Stated that she was wondering if she had a stroke or if it is something that is going to pass.   I called patient back and had to [facility] to return call.

## 2017-04-30 NOTE — Discharge Instructions (Signed)
Your evaluated in the emergency department for some tingling on the right side of your body in the setting of a recent respiratory illness.  It is possible this is related to the medications you were taking and you have since stopped from.  We did a CAT scan and some blood work which did not show an obvious cause of your symptoms.  He will need to call your primary care doctor on Monday for follow-up and we have also given you the number for the neurology clinic.  If your symptoms worsen in any way you should return to the emergency department for further evaluation.

## 2017-04-30 NOTE — ED Provider Notes (Signed)
[facility] EMERGENCY DEPARTMENT Provider Note   CSN: [id number] Arrival date & time: 04/30/17  1322     History   Chief Complaint Chief Complaint  Patient presents with  . Tingling    HPI Angela Frost is a 57 y.o. female.  She states that about a week and a half ago she was having an upper respiratory illness and was treated with Tessalon Perles and prednisone.  1 or both of these medicines began to cause her tingling around her mouth and also difficulty sleeping.  She took them for about 4 days and then stopped them and then has been using meclizine to help her sleep and because she was feeling dizzy.  He states she has had this dizziness before and was prescribed meclizine.  These symptoms have resolved but she is noticed for a week now that she has had some tingling in her right face right arm and right leg some subtle weakness.  She called her primary care doctor today who referred her to the ED for evaluation.  She states she is having no troubles with her gait no urinary symptoms she has been able to write and use her hand easily but they just feel weaker to her.  Is been no trauma no fever and all of her rest tori symptoms have improved.  She has no prior history of stroke.  The history is provided by the patient.  Weakness  Primary symptoms include focal weakness. This is a new problem. The current episode started more than 1 week ago. The problem has not changed since onset.There was right upper extremity and right lower extremity focality noted. There has been no fever. Pertinent negatives include no shortness of breath, no chest pain, no vomiting, no altered mental status, no confusion and no headaches. Associated medical issues do not include CVA.    Past Medical History:  Diagnosis Date  . Depression   . Hot flashes   . Hypertension   . Overweight   . Thyroid disease     Patient Active Problem List   Diagnosis Date Noted  . Essential hypertension 07/31/2016   . Class 1 obesity due to excess calories without serious comorbidity with body mass index (BMI) of 31.0 to 31.9 in adult 07/31/2016  . 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 Insomnia 07/31/2016  . Tinea 07/31/2016    Past Surgical History:  Procedure Laterality Date  . ABDOMINAL HYSTERECTOMY  07/18/2011   Dr.Mattern   . COLONOSCOPY  2016   Patient Reported     OB History    No data available       Home Medications    Prior to Admission medications   Medication Sig Start Date End Date Taking? Authorizing Provider  cyclobenzaprine (FLEXERIL) 5 MG tablet Take 1 tablet (5 mg total) by mouth 2 (two) times daily as needed for muscle spasms. 11/29/16   Maczis, Barth Kirks, PA-C  levothyroxine (SYNTHROID, LEVOTHROID) 25 MCG tablet Take 1 tablet (25 mcg total) by mouth daily before breakfast. take it on an empty stomach, at least 30 minutes before breakfast. 11/05/16   Gildardo Cranker, DO  Naftifine HCl 2 % CREA Apply twice a day 07/31/16   Gildardo Cranker, DO  PARoxetine (PAXIL) 20 MG tablet Take 1 tablet (20 mg total) by mouth daily. 01/20/17   Gildardo Cranker, DO  phentermine 37.5 MG capsule Take one capsule by mouth once daily in the morning 02/18/17   Gildardo Cranker, DO  rOPINIRole (REQUIP) 0.25 MG tablet Take one tablet by mouth at bedtime for 3 days then increase to 2 tablets by mouth at bedtime 02/07/16   Gildardo Cranker, DO  tiZANidine (ZANAFLEX) 4 MG tablet Take 1 tablet (4 mg total) by mouth every 8 (eight) hours as needed for muscle spasms. 02/21/16   Gildardo Cranker, DO  traMADol (ULTRAM) 50 MG tablet Take 1 tablet (50 mg total) by mouth every 8 (eight) hours as needed. 11/29/16   Maczis, Barth Kirks, PA-C  traZODone (DESYREL) 50 MG tablet TAKE 2 TABLETS BY MOUTH EVERY NIGHT AT BEDTIME 04/19/17   Gildardo Cranker, DO  valsartan-hydrochlorothiazide (DIOVAN-HCT) 80-12.5 MG tablet TAKE 1 TABLET BY MOUTH DAILY 03/22/17   Gildardo Cranker, DO    Family History Family  History  Problem Relation Age of Onset  . High blood pressure Father   . Heart disease Mother   . Diabetes Sister   . Obesity Sister   . Diabetes Brother   . Arthritis Brother   . High blood pressure Sister   . High blood pressure Sister   . High blood pressure Brother     Social History Social History   Tobacco Use  . Smoking status: Never Smoker  . Smokeless tobacco: Never Used  Substance Use Topics  . Alcohol use: No    Alcohol/week: 0.0 oz  . Drug use: No     Allergies   Estradiol   Review of Systems Review of Systems  Constitutional: Negative for chills and fever.  HENT: Negative for ear pain and sore throat.   Eyes: Negative for pain and visual disturbance.  Respiratory: Negative for cough and shortness of breath.   Cardiovascular: Negative for chest pain and palpitations.  Gastrointestinal: Negative for abdominal pain and vomiting.  Genitourinary: Negative for dysuria and hematuria.  Musculoskeletal: Negative for arthralgias and back pain.  Skin: Negative for color change and rash.  Neurological: Positive for focal weakness, weakness and numbness. Negative for seizures, syncope and headaches.  Psychiatric/Behavioral: Negative for confusion.  All other systems reviewed and are negative.    Physical Exam Updated Vital Signs BP 132/67 (BP Location: Left Arm)   Pulse 77   Temp 98.5 F (36.9 C) (Oral)   Resp 18   Ht 5\' 3"  (1.6 m)   Wt 84.8 kg (187 lb)   SpO2 100%   BMI 33.13 kg/m   Physical Exam  Constitutional: She appears well-developed and well-nourished. No distress.  HENT:  Head: Normocephalic and atraumatic.  Eyes: Conjunctivae are normal.  Neck: Neck supple.  Cardiovascular: Normal rate and regular rhythm.  No murmur heard. Pulmonary/Chest: Effort normal and breath sounds normal. No respiratory distress.  Abdominal: Soft. There is no tenderness.  Musculoskeletal: She exhibits no edema.  Neurological: She is alert. She has normal  strength. A sensory deficit (subjective decrease face, arm, leg on right) is present. No cranial nerve deficit. She displays a negative Romberg sign. Coordination and gait normal. GCS eye subscore is 4. GCS verbal subscore is 5. GCS motor subscore is 6.  Skin: Skin is warm and dry.  Psychiatric: She has a normal mood and affect.  Nursing note and vitals reviewed.    ED Treatments / Results  Labs (all labs ordered are listed, but only abnormal results are displayed) Labs Reviewed  BASIC METABOLIC PANEL - Abnormal; Notable for the following components:      Result Value   Glucose, Bld 109 (*)    Calcium 8.8 (*)    All other  components within normal limits  CBC WITH DIFFERENTIAL/PLATELET    EKG  EKG Interpretation  Date/Time:  Friday April 30 2017 14:47:45 EDT Ventricular Rate:  61 PR Interval:    QRS Duration: 89 QT Interval:  410 QTC Calculation: 413 R Axis:   60 Text Interpretation:  Sinus rhythm nl intervals nl acute st/ts no prior to compare with.  Confirmed by Aletta Edouard [id number] on 04/30/2017 3:01:03 PM       Radiology Ct Head Wo Contrast  Result Date: 04/30/2017 CLINICAL DATA:  Tingling around the mouth and down right arm x1 week. History of high blood pressure. EXAM: CT HEAD WITHOUT CONTRAST TECHNIQUE: Contiguous axial images were obtained from the base of the skull through the vertex without intravenous contrast. COMPARISON:  None. FINDINGS: Brain: Normal variant cavum septum pellucidum and vergae. Gray-white matter distinction is maintained. No acute intracranial hemorrhage, midline shift or edema. No large vascular territory infarct. No intra-axial mass nor extra-axial collections. Vascular: No hyperdense vessel sign. Skull: No acute fracture nor suspicious osseous abnormality Sinuses/Orbits: Clear paranasal sinuses and mastoids. Intact orbits and globes. Other: None IMPRESSION: No acute intracranial abnormality. Electronically Signed   By: Ashley Royalty M.D.   On:  04/30/2017 15:04    Procedures Procedures (including critical care time)  Medications Ordered in ED Medications - No data to display   Initial Impression / Assessment and Plan / ED Course  I have reviewed the triage vital signs and the nursing notes.  Pertinent labs & imaging results that were available during my care of the patient were reviewed by me and considered in my medical decision making (see chart for details).  Clinical Course as of May 01 1920  Fri Apr 30, 2017  1537 I have reviewed the findings with the patient and her family regarding her CAT scan and her blood work.  Essentially her workup is been negative here.  Her neuro findings to my exam are some subjective decreased sensation on the face arm and leg.  The back is been going on for a week and there is no findings on CT and it seemingly is improving per the patient's report I feel she can follow-up with her primary care doctor for evaluation on Monday.  [MB]    Clinical Course User Index [MB] Hayden Rasmussen, MD     Final Clinical Impressions(s) / ED Diagnoses   Final diagnoses:  Paresthesia    ED Discharge Orders    None       Hayden Rasmussen, MD 05/01/17 [place]

## 2017-04-30 NOTE — ED Triage Notes (Signed)
Patient states that she was on medications last week and she stopped them because she was having tingling to her lips, fingers and legs. The patient states that she thought she was gettign better but called her dr today to talk to her about the fact that she is still having the tingling to her whole right side and was told to come here. The patients neuro assessment WNL with steady gait and no noted distress.

## 2017-04-30 NOTE — Telephone Encounter (Signed)
Patient called back. Stated that she went to Urgent care a week ago for cough. They prescribed her a cough medication and she started it and about  3 days later her tongue and lips were tingling so she discontinued the medication thinking it was a reaction to the mediation. She then was having problems sleeping so she took an OTC medication to help her sleep and the tingling and numbness got worse.   No face droop but stated that when her husband looks at her "something is different and just not right"  She stated that her husband is not the only one who has told her this.  Patient stated that she is feeling weakness in her face. I noticed some slur on phone.   I Advised the patient to go ahead and go to the ER to be evaluated. Patient agreed.

## 2017-05-05 ENCOUNTER — Other Ambulatory Visit: Payer: Self-pay | Admitting: Nurse Practitioner

## 2017-05-05 ENCOUNTER — Encounter: Payer: Self-pay | Admitting: Nurse Practitioner

## 2017-05-05 ENCOUNTER — Ambulatory Visit (INDEPENDENT_AMBULATORY_CARE_PROVIDER_SITE_OTHER): Payer: 59 | Admitting: Nurse Practitioner

## 2017-05-05 VITALS — BP 128/84 | HR 96 | Temp 98.0°F | Ht 65.0 in | Wt 200.0 lb

## 2017-05-05 DIAGNOSIS — R202 Paresthesia of skin: Secondary | ICD-10-CM | POA: Diagnosis not present

## 2017-05-05 DIAGNOSIS — E039 Hypothyroidism, unspecified: Secondary | ICD-10-CM

## 2017-05-05 LAB — TSH: TSH: 1 mIU/L (ref 0.40–4.50)

## 2017-05-05 NOTE — Progress Notes (Signed)
Careteam: Patient Care Team: Gildardo Cranker, DO as PCP - General (Internal Medicine)  Advanced Directive information    Allergies  Allergen Reactions  . Estradiol Itching    Chief Complaint  Patient presents with  . Follow-up    Pt is being seen for a ED follow up. Pt was seen at [facility] on 04/30/17 due to tingling throughout her body.      HPI: Patient is a 57 y.o. female seen in the office today for ED follow up.  Went to the ED after she  was having an upper respiratory illness and was treated with Tessalon Perles and prednisone.  1 or both of these medicines began to cause her tingling around her mouth and also difficulty sleeping. She ws having tingling in the right side of her face and arm and leg.  CT and exam were WNL without acute findings.  Reports she has been doing fine, tingling has gradually went away.   Stopped thyroid medication after last lab when she was told everything looked good. Has not been taking thyroid medication for 4 months. Thought she did not need to take medication anymore. Was placed on medication for subclinical hypothyroid symptoms of inability to lose weight and increased depression   Review of Systems:  Review of Systems  Constitutional: Negative for chills, fever and malaise/fatigue.  HENT: Negative for congestion and sore throat.   Respiratory: Negative for cough and shortness of breath.   Cardiovascular: Negative for chest pain.  Gastrointestinal: Negative for abdominal pain and heartburn.  Neurological: Negative for dizziness, tingling, sensory change and headaches.  Psychiatric/Behavioral: Positive for depression (stable.).    Past Medical History:  Diagnosis Date  . Depression   . Hot flashes   . Hypertension   . Overweight   . Thyroid disease    Past Surgical History:  Procedure Laterality Date  . ABDOMINAL HYSTERECTOMY  07/18/2011   Dr.Mattern   . COLONOSCOPY  2016   Patient Reported    Social History:   reports that  has never smoked. she has never used smokeless tobacco. She reports that she does not drink alcohol or use drugs.  Family History  Problem Relation Age of Onset  . High blood pressure Father   . Heart disease Mother   . Diabetes Sister   . Obesity Sister   . Diabetes Brother   . Arthritis Brother   . High blood pressure Sister   . High blood pressure Sister   . High blood pressure Brother     Medications: Patient's Medications  New Prescriptions   No medications on file  Previous Medications   LEVOTHYROXINE (SYNTHROID, LEVOTHROID) 25 MCG TABLET    Take 1 tablet (25 mcg total) by mouth daily before breakfast. take it on an empty stomach, at least 30 minutes before breakfast.   NAFTIFINE HCL 2 % CREA    Apply twice a day   PAROXETINE (PAXIL) 20 MG TABLET    Take 1 tablet (20 mg total) by mouth daily.   TRAZODONE (DESYREL) 50 MG TABLET    TAKE 2 TABLETS BY MOUTH EVERY NIGHT AT BEDTIME   VALSARTAN-HYDROCHLOROTHIAZIDE (DIOVAN-HCT) 80-12.5 MG TABLET    TAKE 1 TABLET BY MOUTH DAILY  Modified Medications   No medications on file  Discontinued Medications   CYCLOBENZAPRINE (FLEXERIL) 5 MG TABLET    Take 1 tablet (5 mg total) by mouth 2 (two) times daily as needed for muscle spasms.   PHENTERMINE 37.5 MG CAPSULE  Take one capsule by mouth once daily in the morning   ROPINIROLE (REQUIP) 0.25 MG TABLET    Take one tablet by mouth at bedtime for 3 days then increase to 2 tablets by mouth at bedtime   TIZANIDINE (ZANAFLEX) 4 MG TABLET    Take 1 tablet (4 mg total) by mouth every 8 (eight) hours as needed for muscle spasms.   TRAMADOL (ULTRAM) 50 MG TABLET    Take 1 tablet (50 mg total) by mouth every 8 (eight) hours as needed.     Physical Exam:  Vitals:   05/05/17 0900  BP: 128/84  Pulse: 96  Temp: 98 F (36.7 C)  TempSrc: Oral  SpO2: 98%  Weight: 200 lb (90.7 kg)  Height: 5\' 5"  (1.651 m)   Body mass index is 33.28 kg/m.  Physical Exam  Constitutional: She is  oriented to person, place, and time. She appears well-developed and well-nourished.  HENT:  Right Ear: External ear normal.  Mouth/Throat: Oropharynx is clear and moist.  MMM; no oral thrush  Eyes: Pupils are equal, round, and reactive to light.  Neck: Normal range of motion. Neck supple. Carotid bruit is not present.  Cardiovascular: Normal rate, regular rhythm and normal heart sounds.  No LE edema b/l. no calf TTP.   Pulmonary/Chest: Effort normal and breath sounds normal.  Abdominal: Soft. Normal appearance and bowel sounds are normal. There is no hepatosplenomegaly. There is no rigidity.  Increased abdominal girth  Lymphadenopathy:    She has no cervical adenopathy.  Neurological: She is alert and oriented to person, place, and time. She has normal reflexes.  Skin: Skin is warm and dry.  Psychiatric: She has a normal mood and affect. Her behavior is normal. Judgment and thought content normal.    Labs reviewed: Basic Metabolic Panel: Recent Labs    07/28/16 0858 11/03/16 1528 12/11/16 1000 04/30/17 1444  NA 141 137  --  139  K 4.2 4.2  --  3.5  CL 108 102  --  104  CO2 25 26  --  28  GLUCOSE 94 88  --  109*  BUN 11 16  --  18  CREATININE 0.89 1.14*  --  0.90  CALCIUM 8.9 9.3  --  8.8*  TSH 1.14 1.94 0.97  --    Liver Function Tests: Recent Labs    07/28/16 0858 11/03/16 1528  AST 15 19  ALT 13 16  ALKPHOS 96  --   BILITOT 0.4 0.3  PROT 6.2 6.5  ALBUMIN 3.5*  --    No results for input(s): LIPASE, AMYLASE in the last 8760 hours. No results for input(s): AMMONIA in the last 8760 hours. CBC: Recent Labs    07/28/16 0858 04/30/17 1444  WBC 3.8 7.0  NEUTROABS 1,862 4.0  HGB 13.2 13.9  HCT 40.4 40.3  MCV 84.5 81.3  PLT 217 208   Lipid Panel: Recent Labs    07/28/16 0858  CHOL 214*  HDL 69  LDLCALC 133*  TRIG 60  CHOLHDL 3.1   TSH: Recent Labs    07/28/16 0858 11/03/16 1528 12/11/16 1000  TSH 1.14 1.94 0.97   A1C: No results found for:  HGBA1C   Assessment/Plan 1. Acquired hypothyroidism - TSH  2. Paresthesia -resolved   Next appt:  Carlos American. [place], [facility] [phone]

## 2017-05-06 ENCOUNTER — Other Ambulatory Visit: Payer: Self-pay | Admitting: Internal Medicine

## 2017-05-06 NOTE — Telephone Encounter (Signed)
Patient stated she is no longer taking this medication, not working.

## 2017-06-05 ENCOUNTER — Other Ambulatory Visit: Payer: Self-pay | Admitting: Internal Medicine

## 2017-06-05 DIAGNOSIS — I10 Essential (primary) hypertension: Secondary | ICD-10-CM

## 2017-06-08 ENCOUNTER — Other Ambulatory Visit: Payer: Self-pay | Admitting: *Deleted

## 2017-06-08 DIAGNOSIS — I10 Essential (primary) hypertension: Secondary | ICD-10-CM

## 2017-06-08 MED ORDER — VALSARTAN-HYDROCHLOROTHIAZIDE 80-12.5 MG PO TABS
1.0000 | ORAL_TABLET | Freq: Every day | ORAL | 1 refills | Status: AC
Start: 2017-06-08 — End: ?

## 2017-06-08 NOTE — Telephone Encounter (Signed)
Received fax from [facility] stating Insurance required 90 day supply.

## 2017-07-01 ENCOUNTER — Telehealth: Payer: Self-pay | Admitting: *Deleted

## 2017-07-01 NOTE — Telephone Encounter (Signed)
Patient called and stated that she would like a refill on Phentermine for weight loss. Medication is not in current medication list. Is this ok to add back to her medication list and refill. Please Advise.

## 2017-07-02 NOTE — Telephone Encounter (Signed)
Patient called back and left message on clinical intake and stated to [facility] regarding message.   Called patient back and LMOM regarding Dr. Vale Haven response.

## 2017-07-02 NOTE — Telephone Encounter (Signed)
She needs to be seen to discuss resuming phentermine

## 2017-07-02 NOTE — Telephone Encounter (Signed)
LMOM to return call.

## 2017-07-06 ENCOUNTER — Encounter: Payer: Self-pay | Admitting: Internal Medicine

## 2017-07-06 ENCOUNTER — Ambulatory Visit: Payer: 59 | Admitting: Internal Medicine

## 2017-07-06 ENCOUNTER — Ambulatory Visit (INDEPENDENT_AMBULATORY_CARE_PROVIDER_SITE_OTHER): Payer: 59 | Admitting: Internal Medicine

## 2017-07-06 VITALS — BP 124/62 | HR 73 | Temp 98.2°F | Resp 18 | Ht 65.0 in | Wt 202.4 lb

## 2017-07-06 DIAGNOSIS — E6609 Other obesity due to excess calories: Secondary | ICD-10-CM | POA: Diagnosis not present

## 2017-07-06 DIAGNOSIS — E039 Hypothyroidism, unspecified: Secondary | ICD-10-CM

## 2017-07-06 DIAGNOSIS — K625 Hemorrhage of anus and rectum: Secondary | ICD-10-CM | POA: Diagnosis not present

## 2017-07-06 DIAGNOSIS — D126 Benign neoplasm of colon, unspecified: Secondary | ICD-10-CM | POA: Diagnosis not present

## 2017-07-06 DIAGNOSIS — I10 Essential (primary) hypertension: Secondary | ICD-10-CM

## 2017-07-06 DIAGNOSIS — F325 Major depressive disorder, single episode, in full remission: Secondary | ICD-10-CM | POA: Diagnosis not present

## 2017-07-06 DIAGNOSIS — R151 Fecal smearing: Secondary | ICD-10-CM | POA: Diagnosis not present

## 2017-07-06 DIAGNOSIS — Z6833 Body mass index (BMI) 33.0-33.9, adult: Secondary | ICD-10-CM

## 2017-07-06 DIAGNOSIS — R635 Abnormal weight gain: Secondary | ICD-10-CM

## 2017-07-06 DIAGNOSIS — E038 Other specified hypothyroidism: Secondary | ICD-10-CM

## 2017-07-06 LAB — CBC WITH DIFFERENTIAL/PLATELET
BASOS PCT: 0.2 %
Basophils Absolute: 10 cells/uL (ref 0–200)
EOS PCT: 3.9 %
Eosinophils Absolute: 199 cells/uL (ref 15–500)
HEMATOCRIT: 42.3 % (ref 35.0–45.0)
Hemoglobin: 14.2 g/dL (ref 11.7–15.5)
LYMPHS ABS: 1729 {cells}/uL (ref 850–3900)
MCH: 27.2 pg (ref 27.0–33.0)
MCHC: 33.6 g/dL (ref 32.0–36.0)
MCV: 80.9 fL (ref 80.0–100.0)
MPV: 10.6 fL (ref 7.5–12.5)
Monocytes Relative: 7.8 %
NEUTROS ABS: 2764 {cells}/uL (ref 1500–7800)
NEUTROS PCT: 54.2 %
Platelets: 249 10*3/uL (ref 140–400)
RBC: 5.23 10*6/uL — AB (ref 3.80–5.10)
RDW: 13.1 % (ref 11.0–15.0)
Total Lymphocyte: 33.9 %
WBC: 5.1 10*3/uL (ref 3.8–10.8)
WBCMIX: 398 {cells}/uL (ref 200–950)

## 2017-07-06 LAB — COMPLETE METABOLIC PANEL WITH GFR
AG RATIO: 1.6 (calc) (ref 1.0–2.5)
ALT: 14 U/L (ref 6–29)
AST: 18 U/L (ref 10–35)
Albumin: 4.2 g/dL (ref 3.6–5.1)
Alkaline phosphatase (APISO): 110 U/L (ref 33–130)
BUN: 18 mg/dL (ref 7–25)
CALCIUM: 9.5 mg/dL (ref 8.6–10.4)
CO2: 28 mmol/L (ref 20–32)
CREATININE: 0.86 mg/dL (ref 0.50–1.05)
Chloride: 105 mmol/L (ref 98–110)
GFR, EST NON AFRICAN AMERICAN: 76 mL/min/{1.73_m2} (ref >=60)
GFR, Est African American: 88 mL/min/{1.73_m2} (ref >=60)
GLOBULIN: 2.7 g/dL (ref 1.9–3.7)
Glucose, Bld: 89 mg/dL (ref 65–139)
Potassium: 3.9 mmol/L (ref 3.5–5.3)
Sodium: 140 mmol/L (ref 135–146)
TOTAL PROTEIN: 6.9 g/dL (ref 6.1–8.1)
Total Bilirubin: 0.4 mg/dL (ref 0.2–1.2)

## 2017-07-06 LAB — TSH: TSH: 1.07 mIU/L (ref 0.40–4.50)

## 2017-07-06 LAB — T4, FREE: FREE T4: 1 ng/dL (ref 0.8–1.8)

## 2017-07-06 MED ORDER — PHENTERMINE HCL 37.5 MG PO CAPS: 37.5000 mg | ORAL_CAPSULE | ORAL | 3 refills | Status: DC

## 2017-07-06 NOTE — Patient Instructions (Signed)
START PHENTERMINE 37.5MCG DAILY FOR WEIGHT MANAGEMENT  RESUME DIET AND EXERCISE PROGRAM  Will call with lab results and referral to GI specialist  Follow up in 1 month for obesity and rectal bleeding

## 2017-07-06 NOTE — Progress Notes (Signed)
Patient ID: Angela Frost, female   DOB: January 22, 1961, 57 y.o.   MRN: [id number]   Location:  [facility] OFFICE  Provider: DR Arletha Grippe  Code Status: FULL CODE Goals of Care:  Advanced Directives 04/30/2017  Does Patient Have a Medical Advance Directive? No  Type of Advance Directive -  Does patient want to make changes to medical advance directive? -  Copy of [place] in Chart? -  Would patient like information on creating a medical advance directive? No - Patient declined     Chief Complaint  Patient presents with  . Medical Management of Chronic Issues    Patient stated that she wanted to get back on her weight loss diet.   . Medication Refill    No refills needed at this time    HPI: Patient is a 57 y.o. female seen today for medical management of chronic diseases. She is c/a brick colored stool at least 2 times per week x 67yrbut has been worsening in last 6 mos. She had 1 episode of bowel incontinence last month. [facility]omat[facility] poly[facility]. She is due for repeat colonoscopy this year.  MDD - depression improve and she stopped fluoxetine and resumed paroxetine. No SI/HI. hotflashes are intermittent but no night sweats/insomnia.   Obesity - she is willing to try phentermine again. She had no success with wt loss last year with diet and exercise. Phentermine at the time was ineffective. BMI 33.86 and she has gained a total of 7 lbs since Sept 2018.  Subclinical hypothyroidism - TSH stable off levothyroxine. She continues to have difficulty with weight loss and depression. TSH 1.00   Past Medical History:  Diagnosis Date  . Depression   . Hot flashes   . Hypertension   . Overweight   . Thyroid disease     Past Surgical History:  Procedure Laterality Date  . ABDOMINAL HYSTERECTOMY  07/18/2011   Dr.Mattern   . COLONOSCOPY  2016   Patient Reported      reports that she has never smoked. She has never used  smokeless tobacco. She reports that she does not drink alcohol or use drugs. Social History   Socioeconomic History  . Marital status: Married    Spouse name: Not on file  . Number of children: Not on file  . Years of education: Not on file  . Highest education level: Not on file  Occupational History  . Not on file  Social Needs  . Financial resource strain: Not on file  . Food insecurity:    Worry: Not on file    Inability: Not on file  . Transportation needs:    Medical: Not on file    Non-medical: Not on file  Tobacco Use  . Smoking status: Never Smoker  . Smokeless tobacco: Never Used  Substance and Sexual Activity  . Alcohol use: No    Alcohol/week: 0.0 oz  . Drug use: No  . Sexual activity: Yes  Lifestyle  . Physical activity:    Days per week: Not on file    Minutes per session: Not on file  . Stress: Not on file  Relationships  . Social connections:    Talks on phone: Not on file    Gets together: Not on file    Attends religious service: Not on file    Active member of club or organization: Not on file    Attends meetings of clubs or  organizations: Not on file    Relationship status: Not on file  . Intimate partner violence:    Fear of current or ex partner: Not on file    Emotionally abused: Not on file    Physically abused: Not on file    Forced sexual activity: Not on file  Other Topics Concern  . Not on file  Social History Narrative   Diet: None   Caffeine: Yes   Married: Yes, 2001   House: 2 stories, 2 persons   Pets: No   Current/Past profession: None listed    Exercise: Yes   Living Will: Yes   DNR: N/A   POA/HPOA: N/A       Family History  Problem Relation Age of Onset  . High blood pressure Father   . Heart disease Mother   . Diabetes Sister   . Obesity Sister   . Diabetes Brother   . Arthritis Brother   . High blood pressure Sister   . High blood pressure Sister   . High blood pressure Brother     Allergies  Allergen  Reactions  . Estradiol Itching    Outpatient Encounter Medications as of 07/06/2017  Medication Sig  . Naftifine HCl 2 % CREA Apply twice a day  . PARoxetine (PAXIL) 20 MG tablet Take 1 tablet (20 mg total) by mouth daily.  . traZODone (DESYREL) 50 MG tablet TAKE 2 TABLETS BY MOUTH EVERY NIGHT AT BEDTIME  . valsartan-hydrochlorothiazide (DIOVAN-HCT) 80-12.5 MG tablet Take 1 tablet by mouth daily.   No facility-administered encounter medications on file as of 07/06/2017.     Review of Systems:  Review of Systems  Psychiatric/Behavioral: Positive for dysphoric mood. Negative for sleep disturbance.  All other systems reviewed and are negative.   Health Maintenance  Topic Date Due  . Hepatitis C Screening  03-15-1960  . HIV Screening  11/14/1975  . COLONOSCOPY  07/07/2017  . MAMMOGRAM  09/08/2017  . INFLUENZA VACCINE  09/16/2017  . PAP SMEAR  11/17/2018  . TETANUS/TDAP  08/01/2026    Physical Exam: Vitals:   07/06/17 0859  BP: 124/62  Pulse: 73  Resp: 18  Temp: 98.2 F (36.8 C)  TempSrc: Oral  SpO2: 98%  Weight: 202 lb 6.4 oz (91.8 kg)  Height: _0  (1.651 m)   Body mass index is 33.68 kg/m. Physical Exam  Constitutional: She is oriented to person, place, and time. She appears well-developed and well-nourished.  HENT:  Mouth/Throat: Oropharynx is clear and moist. No oropharyngeal exudate.  MMM; no oral thrush  Eyes: Pupils are equal, round, and reactive to light. No scleral icterus.  Neck: Neck supple. Carotid bruit is not present. No tracheal deviation present. Thyromegaly (mild) present.  Cardiovascular: Normal rate, regular rhythm and intact distal pulses. Exam reveals no gallop and no friction rub.  Murmur (1/6 SEM) heard. No LE edema b/l. no calf TTP.   Pulmonary/Chest: Effort normal and breath sounds normal. No stridor. No respiratory distress. She has no wheezes. She has no rales.  Abdominal: Soft. Normal appearance and bowel sounds are normal. She exhibits  distension. She exhibits no mass. There is no hepatomegaly. There is no tenderness. There is no rigidity, no rebound and no guarding. No hernia.  obese  Lymphadenopathy:    She has no cervical adenopathy.  Neurological: She is alert and oriented to person, place, and time. She has normal reflexes.  Skin: Skin is warm and dry. No rash noted.  Psychiatric: She has a normal  mood and affect. Her behavior is normal. Judgment and thought content normal.    Labs reviewed: Basic Metabolic Panel: Recent Labs    07/28/16 0858 11/03/16 1528 12/11/16 1000 04/30/17 1444 05/05/17 0934  NA 141 137  --  139  --   K 4.2 4.2  --  3.5  --   CL 108 102  --  104  --   CO2 25 26  --  28  --   GLUCOSE 94 88  --  109*  --   BUN 11 16  --  18  --   CREATININE 0.89 1.14*  --  0.90  --   CALCIUM 8.9 9.3  --  8.8*  --   TSH 1.14 1.94 0.97  --  1.00   Liver Function Tests: Recent Labs    07/28/16 0858 11/03/16 1528  AST 15 19  ALT 13 16  ALKPHOS 96  --   BILITOT 0.4 0.3  PROT 6.2 6.5  ALBUMIN 3.5*  --    No results for input(s): LIPASE, AMYLASE in the last 8760 hours. No results for input(s): AMMONIA in the last 8760 hours. CBC: Recent Labs    07/28/16 0858 04/30/17 1444  WBC 3.8 7.0  NEUTROABS 1,862 4.0  HGB 13.2 13.9  HCT 40.4 40.3  MCV 84.5 81.3  PLT 217 208   Lipid Panel: Recent Labs    07/28/16 0858  CHOL 214*  HDL 69  LDLCALC 133*  TRIG 60  CHOLHDL 3.1   No results found for: HGBA1C  Procedures since last visit: No results found.  Assessment/Plan   ICD-10-CM   1. Rectal bleeding K62.5 Ambulatory referral to Gastroenterology    CBC with Differential/Platelets  2. Adenomatous polyp of colon, unspecified part of colon D12.6 Ambulatory referral to Gastroenterology  3. Weight gain R63.5   4. Depression, major, single episode, complete remission ([place]) F32.5   5. Subclinical hypothyroidism E03.9 TSH    T4, Free  6. Class 1 obesity due to excess calories without serious  comorbidity with body mass index (BMI) of 33.0 to 33.9 in adult E66.09 CMP with eGFR([facility])   Z68.33 phentermine 37.5 MG capsule  7. Fecal smearing R15.1 Ambulatory referral to Gastroenterology  8. Essential hypertension I10 CMP with eGFR([facility])   START PHENTERMINE 37.5MCG DAILY FOR WEIGHT MANAGEMENT - if no response will consider resuming thyroid medication  RESUME DIET AND EXERCISE PROGRAM  Will call with lab results and referral to GI specialist  Follow up in 1 month for obesity and rectal bleeding    Shalene Gallen S. Perlie Gold  [facility] and Adult Medicine [address] [place], [place] [address] [phone] Cell (Monday-Friday 8 AM - 5 PM) [phone] After 5 PM and follow prompts

## 2017-07-08 ENCOUNTER — Other Ambulatory Visit: Payer: Self-pay | Admitting: Internal Medicine

## 2017-07-09 MED ORDER — PAROXETINE HCL 20 MG PO TABS: ORAL_TABLET | ORAL | 1 refills | Status: DC

## 2017-07-09 NOTE — Addendum Note (Signed)
Addended by: Denyse Amass on: 07/09/2017 08:58 AM   Modules accepted: Orders

## 2017-07-09 NOTE — Telephone Encounter (Signed)
Pharmacy called to say that a 90 day Rx needed to be sent due to insurance requirements. Rx sent for #90

## 2017-07-16 ENCOUNTER — Ambulatory Visit: Payer: 59 | Admitting: Internal Medicine

## 2017-08-10 ENCOUNTER — Other Ambulatory Visit: Payer: Self-pay | Admitting: Internal Medicine

## 2017-08-11 ENCOUNTER — Ambulatory Visit (INDEPENDENT_AMBULATORY_CARE_PROVIDER_SITE_OTHER): Payer: 59 | Admitting: Internal Medicine

## 2017-08-11 ENCOUNTER — Encounter: Payer: Self-pay | Admitting: Internal Medicine

## 2017-08-11 ENCOUNTER — Ambulatory Visit: Payer: 59 | Admitting: Internal Medicine

## 2017-08-11 VITALS — BP 128/78 | HR 64 | Temp 97.7°F | Resp 10 | Ht 65.0 in | Wt 201.8 lb

## 2017-08-11 DIAGNOSIS — Z6833 Body mass index (BMI) 33.0-33.9, adult: Secondary | ICD-10-CM | POA: Diagnosis not present

## 2017-08-11 DIAGNOSIS — E6609 Other obesity due to excess calories: Secondary | ICD-10-CM | POA: Diagnosis not present

## 2017-08-11 MED ORDER — ORLISTAT 120 MG PO CAPS: 120.0000 mg | ORAL_CAPSULE | Freq: Three times a day (TID) | ORAL | 6 refills | Status: DC

## 2017-08-11 NOTE — Patient Instructions (Addendum)
STOP PHENTERMINE  START XENICAL (ORLISTAT) 120MG  3 TIMES DAILY FOR OBESITY - make sure you eat <30% fat in meals  Continue other medications as ordered  Follow up in 1 month for obesity or sooner if need be.

## 2017-08-11 NOTE — Progress Notes (Signed)
Patient ID: Angela Frost, female   DOB: Sep 22, 1960, 57 y.o.   MRN: [id number]   Location:  [facility] OFFICE  Provider: DR Arletha Grippe  Code Status:  Goals of Care:  Advanced Directives 08/11/2017  Does Patient Have a Medical Advance Directive? No  Type of Advance Directive -  Does patient want to make changes to medical advance directive? -  Copy of [place] in Chart? -  Would patient like information on creating a medical advance directive? Yes (MAU/Ambulatory/Procedural Areas - Information given)     Chief Complaint  Patient presents with  . Medical Management of Chronic Issues    1 month follow-up on weight and rectal bleeding. Patient still with rectal bleeding about 1 x weekly   . Medication Management    Patient would like to discuss the option of d/c'ing b/p medication. Patient states when she was started on medication it was a very stressful time in her life.   . Advance Care Planning    Discuss ACP     HPI: Patient is a 57 y.o. female seen today for medical management of chronic diseases of obesity and rectal bleeding. She has lost 1 lb (BMI 33.58) on phentermine 37.5mg  daily. She maintains healthy diet and has a regular exercise routine including cardio 4x per week of 45 min sessions.   Rectal bleeding - she had 2 more episodes since last ov. She is scheduled for colonoscopy in mid July.  Last colonoscopy in 2014 at [facility] revealed tubular adenomatous polyps.   MDD - depression exacerbated since last ov. She takes paroxetine. No SI/HI. hotflashes are intermittent but no night sweats/insomnia.   Obesity - unchanged with phentermine 37.5mg  daily. She had no success with wt loss last year with diet and exercise. Phentermine at the time was ineffective. BMI 33.58 with wt down 1 lb since last month.  Past Medical History:  Diagnosis Date  . Depression   . Hot flashes   . Hypertension   . Overweight   . Thyroid disease     Past Surgical History:    Procedure Laterality Date  . ABDOMINAL HYSTERECTOMY  07/18/2011   Dr.Mattern   . COLONOSCOPY  2016   Patient Reported      reports that she has never smoked. She has never used smokeless tobacco. She reports that she does not drink alcohol or use drugs. Social History   Socioeconomic History  . Marital status: Married    Spouse name: Not on file  . Number of children: Not on file  . Years of education: Not on file  . Highest education level: Not on file  Occupational History  . Not on file  Social Needs  . Financial resource strain: Not on file  . Food insecurity:    Worry: Not on file    Inability: Not on file  . Transportation needs:    Medical: Not on file    Non-medical: Not on file  Tobacco Use  . Smoking status: Never Smoker  . Smokeless tobacco: Never Used  Substance and Sexual Activity  . Alcohol use: No    Alcohol/week: 0.0 oz  . Drug use: No  . Sexual activity: Yes  Lifestyle  . Physical activity:    Days per week: Not on file    Minutes per session: Not on file  . Stress: Not on file  Relationships  . Social connections:    Talks on phone: Not on file    Gets together: Not  on file    Attends religious service: Not on file    Active member of club or organization: Not on file    Attends meetings of clubs or organizations: Not on file    Relationship status: Not on file  . Intimate partner violence:    Fear of current or ex partner: Not on file    Emotionally abused: Not on file    Physically abused: Not on file    Forced sexual activity: Not on file  Other Topics Concern  . Not on file  Social History Narrative   Diet: None   Caffeine: Yes   Married: Yes, 2001   House: 2 stories, 2 persons   Pets: No   Current/Past profession: None listed    Exercise: Yes   Living Will: Yes   DNR: N/A   POA/HPOA: N/A       Family History  Problem Relation Age of Onset  . High blood pressure Father   . Heart disease Mother   . Diabetes Sister   .  Obesity Sister   . Diabetes Brother   . Arthritis Brother   . High blood pressure Sister   . High blood pressure Sister   . High blood pressure Brother     Allergies  Allergen Reactions  . Estradiol Itching    Outpatient Encounter Medications as of 08/11/2017  Medication Sig  . Naftifine HCl 2 % CREA Apply twice a day  . PARoxetine (PAXIL) 20 MG tablet TAKE 1 TABLET BY MOUTH EVERY DAY** STOP FLUOXETINE**  . phentermine 37.5 MG capsule Take 1 capsule (37.5 mg total) by mouth every morning.  . traZODone (DESYREL) 50 MG tablet TAKE 2 TABLETS BY MOUTH EVERY NIGHT AT BEDTIME  . valsartan-hydrochlorothiazide (DIOVAN-HCT) 80-12.5 MG tablet Take 1 tablet by mouth daily.   No facility-administered encounter medications on file as of 08/11/2017.     Review of Systems:  Review of Systems  Psychiatric/Behavioral: Positive for dysphoric mood.  All other systems reviewed and are negative.   Health Maintenance  Topic Date Due  . COLONOSCOPY  09/03/2017 (Originally 07/07/2017)  . Hepatitis C Screening  08/12/2018 (Originally [birth year])  . HIV Screening  08/12/2018 (Originally 11/14/1975)  . MAMMOGRAM  09/08/2017  . INFLUENZA VACCINE  09/16/2017  . PAP SMEAR  11/17/2018  . TETANUS/TDAP  08/01/2026    Physical Exam: Vitals:   08/11/17 1122  BP: 128/78  Pulse: 64  Resp: 10  Temp: 97.7 F (36.5 C)  TempSrc: Oral  SpO2: 96%  Weight: 201 lb 12.8 oz (91.5 kg)  Height: 5\' 5"  (1.651 m)   Body mass index is 33.58 kg/m. Physical Exam  Constitutional: She is oriented to person, place, and time. She appears well-developed and well-nourished.  Neurological: She is alert and oriented to person, place, and time.  Psychiatric: Her behavior is normal. Judgment and thought content normal. She is not actively hallucinating. Cognition and memory are normal. She exhibits a depressed mood.    Labs reviewed: Basic Metabolic Panel: Recent Labs    11/03/16 1528 12/11/16 1000 04/30/17 1444  05/05/17 0934 07/06/17 0948  NA 137  --  139  --  140  K 4.2  --  3.5  --  3.9  CL 102  --  104  --  105  CO2 26  --  28  --  28  GLUCOSE 88  --  109*  --  89  BUN 16  --  18  --  18  CREATININE  1.14*  --  0.90  --  0.86  CALCIUM 9.3  --  8.8*  --  9.5  TSH 1.94 0.97  --  1.00 1.07   Liver Function Tests: Recent Labs    11/03/16 1528 07/06/17 0948  AST 19 18  ALT 16 14  BILITOT 0.3 0.4  PROT 6.5 6.9   No results for input(s): LIPASE, AMYLASE in the last 8760 hours. No results for input(s): AMMONIA in the last 8760 hours. CBC: Recent Labs    04/30/17 1444 07/06/17 0948  WBC 7.0 5.1  NEUTROABS 4.0 2,764  HGB 13.9 14.2  HCT 40.3 42.3  MCV 81.3 80.9  PLT 208 249   Lipid Panel: No results for input(s): CHOL, HDL, LDLCALC, TRIG, CHOLHDL, LDLDIRECT in the last 8760 hours. No results found for: HGBA1C  Procedures since last visit: No results found.  Assessment/Plan   ICD-10-CM   1. Class 1 obesity due to excess calories without serious comorbidity with body mass index (BMI) of 33.0 to 33.9 in adult E66.09 orlistat (XENICAL) 120 MG capsule   Z68.33     STOP PHENTERMINE  START XENICAL (ORLISTAT) 120MG  3 TIMES DAILY FOR OBESITY - make sure you eat <30% fat in meals; side effects discussed  Continue other medications as ordered  Advanced directive information given today  Follow up in 1 month for obesity or sooner if need be.    Patrena Santalucia S. Perlie Gold  [facility] and Adult Medicine [address] [place], [place] [address] [phone] Cell (Monday-Friday 8 AM - 5 PM) [phone] After 5 PM and follow prompts

## 2017-08-24 ENCOUNTER — Telehealth: Payer: Self-pay

## 2017-08-24 NOTE — Telephone Encounter (Signed)
Patient called to say that the new weight loss medication (orlistat) is NOT covered by her insurance. Patient then stated that you had discussed her having a weight loss shot in her stomach and she would like to look into that at this time.

## 2017-08-26 NOTE — Telephone Encounter (Signed)
I spoke with patient and she verbalized understanding. She stated that she would call the office back if it was ok to prescribe.

## 2017-08-26 NOTE — Telephone Encounter (Signed)
Injectable is called saxenda - recommend you check with insurance to determine your out-of-pocket expense prior to prescribing; other option is contrave which is a pill but recommend you also check insurance coverage prior to prescribing

## 2017-09-05 ENCOUNTER — Other Ambulatory Visit: Payer: Self-pay | Admitting: Internal Medicine

## 2017-09-10 ENCOUNTER — Telehealth: Payer: Self-pay

## 2017-09-10 DIAGNOSIS — B359 Dermatophytosis, unspecified: Secondary | ICD-10-CM

## 2017-09-10 NOTE — Telephone Encounter (Signed)
Patient called requesting to speak with Caren Griffins (office manager). Patient states she and Caren Griffins had a conversation about one of her medications not being covered and Caren Griffins was going to further look into this and follow-up with her. Patient was returning Cynthia's call.  Patient aware that Caren Griffins is out of office and will be notified on Monday to call her back. Message handled to patient's apparent satisfaction

## 2017-09-17 MED ORDER — ORLISTAT 120 MG PO CAPS: 120.0000 mg | ORAL_CAPSULE | Freq: Three times a day (TID) | ORAL | 1 refills | Status: DC

## 2017-09-17 NOTE — Telephone Encounter (Signed)
Angela Frost spoke with patient, patient needs rx for obesity medication sent to [facility] on [address]. Patient needs the generic, Xenical 120 mg 3 times daily.  Medication is not currently on medication list as it should be yet it is noted in Dr.Carter's last OV note  Medication is pending for approval, patient was to follow-up and the end of July 2019 (appointment never scheduled). Patient has a pending appointment on 10/15/17

## 2017-09-17 NOTE — Telephone Encounter (Signed)
Patient aware RX sent

## 2017-09-28 ENCOUNTER — Other Ambulatory Visit: Payer: Self-pay | Admitting: *Deleted

## 2017-09-28 MED ORDER — ORLISTAT 120 MG PO CAPS: 120.0000 mg | ORAL_CAPSULE | Freq: Three times a day (TID) | ORAL | 1 refills | Status: DC

## 2017-09-28 NOTE — Telephone Encounter (Signed)
Patient requested. Pharmacy stated that they did not receive the Rx. Refaxed.

## 2017-09-29 ENCOUNTER — Telehealth: Payer: Self-pay

## 2017-09-29 NOTE — Telephone Encounter (Signed)
A fax was received from [facility] stating that a prior authorization is needed for xenical 120 mg capsules.   PA was initiated by calling [phone] for [facility] PA.   PA was approved for 12 months. Ref # [id number].   Pharmacy was notified and they ran the claim while I was on the phone. Pt cost is $50 for a 30 day supply.   Patient's husband was also notified of approval.

## 2017-10-05 ENCOUNTER — Other Ambulatory Visit: Payer: Self-pay | Admitting: Internal Medicine

## 2017-10-06 ENCOUNTER — Encounter: Payer: Self-pay | Admitting: Internal Medicine

## 2017-10-15 ENCOUNTER — Ambulatory Visit: Payer: 59 | Admitting: Internal Medicine

## 2017-11-04 ENCOUNTER — Other Ambulatory Visit: Payer: Self-pay | Admitting: Internal Medicine

## 2017-12-04 ENCOUNTER — Other Ambulatory Visit: Payer: Self-pay | Admitting: Internal Medicine

## 2018-01-03 ENCOUNTER — Telehealth: Payer: Self-pay

## 2018-01-03 NOTE — Telephone Encounter (Signed)
Incoming fax received from [facility] in [place] requesting refill on Trazodone.  Last OV was 08/14/17, patient was instructed to schedule a 1 month follow-up (not scheduled and no pending appointments)   Patient needs to schedule a follow-up with Sherrie Mustache, NP   I sent fax back to [facility] requesting that they send all future request electronically and have patient contact [facility] prior to refill.  Left message on voicemail for patient to return call when available    Awaiting return call from patient  S.Chrae B/CMA

## 2018-01-05 MED ORDER — TRAZODONE HCL 50 MG PO TABS: 100.0000 mg | ORAL_TABLET | Freq: Every day | ORAL | 0 refills | Status: AC

## 2018-01-05 NOTE — Telephone Encounter (Signed)
Spoke with patient, scheduled appointment.   RX refilled x 30 days

## 2018-01-18 ENCOUNTER — Ambulatory Visit: Payer: 59 | Admitting: Nurse Practitioner

## 2018-02-02 ENCOUNTER — Other Ambulatory Visit: Payer: Self-pay | Admitting: Nurse Practitioner

## 2018-02-10 ENCOUNTER — Encounter (HOSPITAL_BASED_OUTPATIENT_CLINIC_OR_DEPARTMENT_OTHER): Payer: Self-pay | Admitting: Emergency Medicine

## 2018-02-10 ENCOUNTER — Other Ambulatory Visit: Payer: Self-pay

## 2018-02-10 ENCOUNTER — Emergency Department (HOSPITAL_BASED_OUTPATIENT_CLINIC_OR_DEPARTMENT_OTHER)
Admission: EM | Admit: 2018-02-10 | Discharge: 2018-02-10 | Disposition: A | Discharge status: Home / Self Care | Payer: 59 | Attending: Emergency Medicine | Admitting: Emergency Medicine

## 2018-02-10 DIAGNOSIS — M25512 Pain in left shoulder: Secondary | ICD-10-CM | POA: Insufficient documentation

## 2018-02-10 DIAGNOSIS — M25511 Pain in right shoulder: Secondary | ICD-10-CM | POA: Diagnosis not present

## 2018-02-10 DIAGNOSIS — I10 Essential (primary) hypertension: Secondary | ICD-10-CM | POA: Diagnosis not present

## 2018-02-10 DIAGNOSIS — G8929 Other chronic pain: Secondary | ICD-10-CM | POA: Diagnosis not present

## 2018-02-10 DIAGNOSIS — Z79899 Other long term (current) drug therapy: Secondary | ICD-10-CM | POA: Diagnosis not present

## 2018-02-10 DIAGNOSIS — R0981 Nasal congestion: Secondary | ICD-10-CM | POA: Diagnosis present

## 2018-02-10 DIAGNOSIS — J069 Acute upper respiratory infection, unspecified: Secondary | ICD-10-CM | POA: Insufficient documentation

## 2018-02-10 MED ORDER — IBUPROFEN 800 MG PO TABS: 800.0000 mg | ORAL_TABLET | Freq: Three times a day (TID) | ORAL | 0 refills | Status: DC | PRN

## 2018-02-10 MED ORDER — IBUPROFEN 800 MG PO TABS
800.0000 mg | ORAL_TABLET | Freq: Once | ORAL | Status: AC
  Administered 2018-02-10: 800 mg via ORAL
  Filled 2018-02-10: qty 1

## 2018-02-10 MED ORDER — DEXAMETHASONE 6 MG PO TABS
10.0000 mg | ORAL_TABLET | Freq: Once | ORAL | Status: AC
  Administered 2018-02-10: 10 mg via ORAL
  Filled 2018-02-10: qty 1

## 2018-02-10 NOTE — ED Provider Notes (Signed)
[facility] Provider Note   CSN: [id number] Arrival date & time: 02/10/18  0709     History   Chief Complaint Chief Complaint  Patient presents with  . Sinus drainage and HA    HPI Angela Frost is a 57 y.o. female.  The history is provided by the patient.  URI   This is a new problem. The current episode started yesterday. The problem has not changed since onset.There has been no fever. The fever has been present for less than 1 day. Associated symptoms include congestion, headaches, rhinorrhea, sinus pain and sore throat. Pertinent negatives include no chest pain, no abdominal pain, no vomiting, no dysuria, no ear pain, no cough, no rash and no wheezing. She has tried NSAIDs for the symptoms. The treatment provided mild relief.    Past Medical History:  Diagnosis Date  . Depression   . Hot flashes   . Hypertension   . Overweight   . Thyroid disease     Patient Active Problem List   Diagnosis Date Noted  . Essential hypertension 07/31/2016  . Class 1 obesity due to excess calories without serious comorbidity with body mass index (BMI) of 31.0 to 31.9 in adult 07/31/2016  . 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 Insomnia 07/31/2016  . Tinea 07/31/2016    Past Surgical History:  Procedure Laterality Date  . ABDOMINAL HYSTERECTOMY  07/18/2011   Dr.Mattern   . COLONOSCOPY  2016   Patient Reported      OB History   No obstetric history on file.      Home Medications    Prior to Admission medications   Medication Sig Start Date End Date Taking? Authorizing Provider  Albuterol Sulfate 108 (90 Base) MCG/ACT AEPB Inhale into the lungs. 04/17/17  Yes [provider]  ibuprofen (ADVIL,MOTRIN) 800 MG tablet Take 1 tablet (800 mg total) by mouth every 8 (eight) hours as needed for up to 30 doses. 02/10/18   Jie Stickels, DO  Naftifine HCl 2 % CREA Apply twice a day 07/31/16   Gildardo Cranker, DO    traZODone (DESYREL) 50 MG tablet Take 2 tablets (100 mg total) by mouth at bedtime. 01/05/18   Lauree Chandler, NP  valsartan-hydrochlorothiazide (DIOVAN-HCT) 80-12.5 MG tablet Take 1 tablet by mouth daily. 06/08/17   Gildardo Cranker, DO    Family History Family History  Problem Relation Age of Onset  . High blood pressure Father   . Heart disease Mother   . Diabetes Sister   . Obesity Sister   . Diabetes Brother   . Arthritis Brother   . High blood pressure Sister   . High blood pressure Sister   . High blood pressure Brother     Social History Social History   Tobacco Use  . Smoking status: Never Smoker  . Smokeless tobacco: Never Used  Substance Use Topics  . Alcohol use: No    Alcohol/week: 0.0 standard drinks  . Drug use: No     Allergies   Estradiol   Review of Systems Review of Systems  Constitutional: Negative for chills and fever.  HENT: Positive for congestion, rhinorrhea, sinus pain and sore throat. Negative for ear pain.   Eyes: Negative for pain and visual disturbance.  Respiratory: Negative for cough, shortness of breath and wheezing.   Cardiovascular: Negative for chest pain and palpitations.  Gastrointestinal: Negative for abdominal pain and vomiting.  Genitourinary: Negative for dysuria and hematuria.  Musculoskeletal:  Positive for arthralgias (chronic shoulder pain). Negative for back pain.  Skin: Negative for color change and rash.  Neurological: Positive for headaches. Negative for seizures and syncope.  All other systems reviewed and are negative.    Physical Exam Updated Vital Signs  ED Triage Vitals  Enc Vitals Group     BP 02/10/18 0715 (!) 147/85     Pulse Rate 02/10/18 0715 79     Resp 02/10/18 0715 16     Temp 02/10/18 0715 98 F (36.7 C)     Temp Source 02/10/18 0715 Oral     SpO2 02/10/18 0715 99 %     Weight 02/10/18 0717 189 lb (85.7 kg)     Height 02/10/18 0717 5\' 4"  (1.626 m)     Head Circumference --      Peak Flow  --      Pain Score 02/10/18 0721 8     Pain Loc --      Pain Edu? --      Excl. in [place]? --      Physical Exam Vitals signs and nursing note reviewed.  Constitutional:      General: She is not in acute distress.    Appearance: She is well-developed.  HENT:     Head: Normocephalic and atraumatic.     Comments: ttp over maxillary sinus,    Nose: Congestion present.     Mouth/Throat:     Mouth: Mucous membranes are moist.     Pharynx: Oropharynx is clear. No oropharyngeal exudate or posterior oropharyngeal erythema.  Eyes:     Extraocular Movements: Extraocular movements intact.     Conjunctiva/sclera: Conjunctivae normal.     Pupils: Pupils are equal, round, and reactive to light.  Neck:     Musculoskeletal: Normal range of motion and neck supple. No neck rigidity or muscular tenderness.  Cardiovascular:     Rate and Rhythm: Normal rate and regular rhythm.     Pulses: Normal pulses.     Heart sounds: Normal heart sounds. No murmur.  Pulmonary:     Effort: Pulmonary effort is normal. No respiratory distress.     Breath sounds: Normal breath sounds.  Abdominal:     Palpations: Abdomen is soft.     Tenderness: There is no abdominal tenderness.  Musculoskeletal: Normal range of motion.        General: No tenderness.  Skin:    General: Skin is warm and dry.     Capillary Refill: Capillary refill takes less than 2 seconds.  Neurological:     General: No focal deficit present.     Mental Status: She is alert and oriented to person, place, and time.     Cranial Nerves: No cranial nerve deficit.     Sensory: No sensory deficit.     Motor: No weakness.     Coordination: Coordination normal.     Comments: 5+/5 strength, normal sensation, no drift, normal finger-to-nose finger      ED Treatments / Results  Labs (all labs ordered are listed, but only abnormal results are displayed) Labs Reviewed - No data to display  EKG None  Radiology No results  found.  Procedures Procedures (including critical care time)  Medications Ordered in ED Medications  ibuprofen (ADVIL,MOTRIN) tablet 800 mg (has no administration in time range)  dexamethasone (DECADRON) tablet 10 mg (has no administration in time range)     Initial Impression / Assessment and Plan / ED Course  I have reviewed the triage  vital signs and the nursing notes.  Pertinent labs & imaging results that were available during my care of the patient were reviewed by me and considered in my medical decision making (see chart for details).     Angela Frost is a 57 year old female who presents to the ED with headache, cold-like symptoms, shoulder pain (chronic).  Patient with normal vitals.  No fever.  Patient with multiple complaints.  She has had headache, sinus congestion, sore throat for the last 2 days.  Has had Aleve once yesterday with some relief.  No fever.  No cough, no sputum production.  Has tenderness over the maxillary sinus on exam.  Has normal neurological exam.  No history of headaches.  No concern for intracranial process such as head bleed, meningitis.  Patient with symptoms were consistent with of viral process.  No signs of pharyngitis on exam.  No signs of retropharyngeal or peritonsillar abscess.  Normal voice, no trismus, no drooling.  Patient with also complaint of chronic bilateral shoulder pain that appears to be arthritic in nature.  Fairly normal range of motion of both shoulders.  No trauma.  Overall we will treat patient with Motrin, Decadron for symptomatic relief.  Recommend continued hydration, Tylenol, Motrin at home.  Given information for primary care follow-up as she may need referral to physical therapy for chronic shoulder pain.  Discharged from ED in good condition and given return precautions.  This chart was dictated using voice recognition software.  Despite best efforts to proofread,  errors can occur which can change the documentation  meaning.   Final Clinical Impressions(s) / ED Diagnoses   Final diagnoses:  Viral URI  Chronic pain of both shoulders    ED Discharge Orders         Ordered    ibuprofen (ADVIL,MOTRIN) 800 MG tablet  Every 8 hours PRN     02/10/18 0726           Lennice Sites, DO 02/10/18 0086

## 2018-02-10 NOTE — ED Triage Notes (Signed)
Sinus drainage down back of throat and HA since yesterday morning.  Also shoulder pain "for a while".

## 2018-02-10 NOTE — ED Notes (Signed)
ED Provider at bedside. 

## 2018-09-22 ENCOUNTER — Emergency Department (HOSPITAL_BASED_OUTPATIENT_CLINIC_OR_DEPARTMENT_OTHER)
Admission: EM | Admit: 2018-09-22 | Discharge: 2018-09-22 | Disposition: A | Discharge status: Home / Self Care | Payer: 59 | Attending: Emergency Medicine | Admitting: Emergency Medicine

## 2018-09-22 ENCOUNTER — Other Ambulatory Visit: Payer: Self-pay

## 2018-09-22 ENCOUNTER — Encounter (HOSPITAL_BASED_OUTPATIENT_CLINIC_OR_DEPARTMENT_OTHER): Payer: Self-pay | Admitting: Emergency Medicine

## 2018-09-22 DIAGNOSIS — I10 Essential (primary) hypertension: Secondary | ICD-10-CM | POA: Insufficient documentation

## 2018-09-22 DIAGNOSIS — R21 Rash and other nonspecific skin eruption: Secondary | ICD-10-CM | POA: Diagnosis present

## 2018-09-22 DIAGNOSIS — T7840XA Allergy, unspecified, initial encounter: Secondary | ICD-10-CM | POA: Diagnosis not present

## 2018-09-22 DIAGNOSIS — Z79899 Other long term (current) drug therapy: Secondary | ICD-10-CM | POA: Insufficient documentation

## 2018-09-22 MED ORDER — PREDNISONE 50 MG PO TABS
60.0000 mg | ORAL_TABLET | Freq: Once | ORAL | Status: AC
  Administered 2018-09-22: 06:00:00 60 mg via ORAL
  Filled 2018-09-22: qty 1

## 2018-09-22 MED ORDER — FAMOTIDINE 20 MG PO TABS
20.0000 mg | ORAL_TABLET | Freq: Once | ORAL | Status: AC
  Administered 2018-09-22: 06:00:00 20 mg via ORAL
  Filled 2018-09-22: qty 1

## 2018-09-22 MED ORDER — FAMOTIDINE 20 MG PO TABS: 20.0000 mg | ORAL_TABLET | Freq: Two times a day (BID) | ORAL | 0 refills | Status: AC

## 2018-09-22 MED ORDER — PREDNISONE 20 MG PO TABS: ORAL_TABLET | ORAL | 0 refills | Status: DC

## 2018-09-22 MED ORDER — PREDNISONE 20 MG PO TABS: ORAL_TABLET | ORAL | 0 refills | Status: AC

## 2018-09-22 MED ORDER — LORATADINE 10 MG PO TABS
10.0000 mg | ORAL_TABLET | Freq: Once | ORAL | Status: AC
  Administered 2018-09-22: 06:00:00 10 mg via ORAL
  Filled 2018-09-22: qty 1

## 2018-09-22 NOTE — ED Provider Notes (Addendum)
[facility] Provider Note   CSN: [id number] Arrival date & time: 09/22/18  0535     History   Chief Complaint Chief Complaint  Patient presents with  . Rash    HPI Angela Frost is a 58 y.o. female.     The history is provided by the patient.  Rash Location:  Shoulder/arm Shoulder/arm rash location:  L arm and R arm Quality: itchiness   Severity:  Moderate Onset quality:  Gradual Duration:  1 week Timing:  Constant Progression:  Resolved Chronicity:  New Context: not animal contact, not chemical exposure, not new detergent/soap and not sick contacts   Relieved by:  Nothing Worsened by:  Nothing Ineffective treatments:  Antihistamines (at 3 am) Associated symptoms: no fever, no periorbital edema, no shortness of breath, no sore throat, no throat swelling, no tongue swelling and not wheezing   Associated symptoms comment:  Feels lips are swollen Feels lips swollen x 2 days.  No tongue swelling no shortness of breath.  No known allergens.   Past Medical History:  Diagnosis Date  . Depression   . Hot flashes   . Hypertension   . Overweight   . Thyroid disease     Patient Active Problem List   Diagnosis Date Noted  . Essential hypertension 07/31/2016  . Class 1 obesity due to excess calories without serious comorbidity with body mass index (BMI) of 31.0 to 31.9 in adult 07/31/2016  . 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 Insomnia 07/31/2016  . Tinea 07/31/2016    Past Surgical History:  Procedure Laterality Date  . ABDOMINAL HYSTERECTOMY  07/18/2011   Dr.Mattern   . COLONOSCOPY  2016   Patient Reported      OB History   No obstetric history on file.      Home Medications    Prior to Admission medications   Medication Sig Start Date End Date Taking? Authorizing Provider  Albuterol Sulfate 108 (90 Base) MCG/ACT AEPB Inhale into the lungs. 04/17/17   [provider]  ibuprofen  (ADVIL,MOTRIN) 800 MG tablet Take 1 tablet (800 mg total) by mouth every 8 (eight) hours as needed for up to 30 doses. 02/10/18   Curatolo, Adam, DO  Naftifine HCl 2 % CREA Apply twice a day 07/31/16   Gildardo Cranker, DO  traZODone (DESYREL) 50 MG tablet Take 2 tablets (100 mg total) by mouth at bedtime. 01/05/18   Lauree Chandler, NP  valsartan-hydrochlorothiazide (DIOVAN-HCT) 80-12.5 MG tablet Take 1 tablet by mouth daily. 06/08/17   Gildardo Cranker, DO    Family History Family History  Problem Relation Age of Onset  . High blood pressure Father   . Heart disease Mother   . Diabetes Sister   . Obesity Sister   . Diabetes Brother   . Arthritis Brother   . High blood pressure Sister   . High blood pressure Sister   . High blood pressure Brother     Social History Social History   Tobacco Use  . Smoking status: Never Smoker  . Smokeless tobacco: Never Used  Substance Use Topics  . Alcohol use: No    Alcohol/week: 0.0 standard drinks  . Drug use: No     Allergies   Estradiol   Review of Systems Review of Systems  Constitutional: Negative for diaphoresis and fever.  HENT: Negative for hearing loss and sore throat.   Eyes: Negative for visual disturbance.  Respiratory: Negative for chest tightness, shortness  of breath, wheezing and stridor.   Cardiovascular: Negative for chest pain.  Genitourinary: Negative for dysuria.  Skin: Positive for rash.  All other systems reviewed and are negative.    Physical Exam Updated Vital Signs Ht 5\' 3"  (1.6 m)   Wt 85.7 kg   BMI 33.48 kg/m   Physical Exam Vitals signs and nursing note reviewed.  Constitutional:      General: She is not in acute distress.    Appearance: She is normal weight.  HENT:     Head: Normocephalic and atraumatic.     Nose: Nose normal.     Mouth/Throat:     Mouth: Mucous membranes are moist.     Pharynx: Oropharynx is clear.     Comments: No swelling of the lips tongue or uvula Eyes:      Conjunctiva/sclera: Conjunctivae normal.     Pupils: Pupils are equal, round, and reactive to light.  Neck:     Musculoskeletal: Normal range of motion and neck supple.  Cardiovascular:     Rate and Rhythm: Normal rate and regular rhythm.     Pulses: Normal pulses.     Heart sounds: Normal heart sounds.  Pulmonary:     Effort: Pulmonary effort is normal. No respiratory distress.     Breath sounds: Normal breath sounds. No wheezing or rhonchi.  Abdominal:     General: Abdomen is flat. Bowel sounds are normal.     Tenderness: There is no abdominal tenderness.  Musculoskeletal: Normal range of motion.  Skin:    General: Skin is warm and dry.     Capillary Refill: Capillary refill takes less than 2 seconds.     Findings: No rash.  Neurological:     General: No focal deficit present.     Mental Status: She is alert and oriented to person, place, and time.  Psychiatric:        Mood and Affect: Mood normal.        Behavior: Behavior normal.      ED Treatments / Results  Labs (all labs ordered are listed, but only abnormal results are displayed) Labs Reviewed - No data to display  EKG None  Radiology No results found.  Procedures Procedures (including critical care time)  Medications Ordered in ED Medications  famotidine (PEPCID) tablet 20 mg (has no administration in time range)  loratadine (CLARITIN) tablet 10 mg (has no administration in time range)  predniSONE (DELTASONE) tablet 60 mg (has no administration in time range)    No swelling of the lips tongue or uvula lungs are clear.  Will start pepcid and steroids.  Strict return precautions given   Final Clinical Impressions(s) / ED Diagnoses   Return for intractable cough, coughing up blood,fevers >100.4 unrelieved by medication, shortness of breath, intractable vomiting, chest pain, shortness of breath, weakness,numbness, changes in speech, facial asymmetry,abdominal pain, passing out,Inability to tolerate  liquids or food, cough, altered mental status or any concerns. No signs of systemic illness or infection. The patient is nontoxic-appearing on exam and vital signs are within normal limits.   I have reviewed the triage vital signs and the nursing notes. Pertinent labs &imaging results that were available during my care of the patient were reviewed by me and considered in my medical decision making (see chart for details).  After history, exam, and medical workup I feel the patient has been appropriately medically screened and is safe for discharge home. Pertinent diagnoses were discussed with the patient. Patient was given return  precautions    Kaileb Monsanto, MD 09/22/18 [place], Janaia Kozel, MD 09/22/18 9833

## 2018-09-22 NOTE — ED Triage Notes (Signed)
Pt states that she has had a itchy rash for about a week. Her lips started swelling yesterday and the rashes itch increased. She has tried benadryl since 0300. No difficulty talking, swallowing, or maintaining secretions

## 2018-09-22 NOTE — ED Notes (Signed)
Pt understood dc material. NAD Noted. Scripts sent electronically. All questions answered to satisfaction. Pt and family escorted to check out window.

## 2022-06-06 ENCOUNTER — Emergency Department (HOSPITAL_BASED_OUTPATIENT_CLINIC_OR_DEPARTMENT_OTHER)
Admission: EM | Admit: 2022-06-06 | Discharge: 2022-06-06 | Disposition: A | Discharge status: Home / Self Care | Payer: 59 | Attending: Emergency Medicine | Admitting: Emergency Medicine

## 2022-06-06 ENCOUNTER — Encounter (HOSPITAL_BASED_OUTPATIENT_CLINIC_OR_DEPARTMENT_OTHER): Payer: Self-pay | Admitting: Emergency Medicine

## 2022-06-06 ENCOUNTER — Other Ambulatory Visit: Payer: Self-pay

## 2022-06-06 DIAGNOSIS — B029 Zoster without complications: Secondary | ICD-10-CM | POA: Insufficient documentation

## 2022-06-06 DIAGNOSIS — I10 Essential (primary) hypertension: Secondary | ICD-10-CM | POA: Insufficient documentation

## 2022-06-06 DIAGNOSIS — M25512 Pain in left shoulder: Secondary | ICD-10-CM | POA: Diagnosis present

## 2022-06-06 DIAGNOSIS — M546 Pain in thoracic spine: Secondary | ICD-10-CM | POA: Insufficient documentation

## 2022-06-06 MED ORDER — VALACYCLOVIR HCL 1 G PO TABS: 1000.0000 mg | ORAL_TABLET | Freq: Three times a day (TID) | ORAL | 0 refills | Status: AC

## 2022-06-06 MED ORDER — VALACYCLOVIR HCL 500 MG PO TABS
1000.0000 mg | ORAL_TABLET | Freq: Once | ORAL | Status: AC
  Administered 2022-06-06: 1000 mg via ORAL
  Filled 2022-06-06: qty 2

## 2022-06-06 MED ORDER — HYDROCODONE-ACETAMINOPHEN 5-325 MG PO TABS: 1.0000 | ORAL_TABLET | Freq: Four times a day (QID) | ORAL | 0 refills | Status: AC | PRN

## 2022-06-06 NOTE — ED Triage Notes (Signed)
Patient c/o upper back pain onset Thursday that has begun radiating to left arm yesterday. Patient denies any other symptoms or strenuous activity. Pt had surgery to left shoulder couple months ago and has been out of work since. Pt denies recent travel or sitting for long periods of time. Patient has insomnia since learning about her niece passing.

## 2022-06-06 NOTE — ED Notes (Addendum)
Entered in error

## 2022-06-06 NOTE — ED Provider Notes (Signed)
[facility] Provider Note   CSN: [id number] Arrival date & time: 06/06/22  0847     History  Chief Complaint  Patient presents with   Back Pain    Angela Frost is a 62 y.o. female.  Patient with history of hypertension, recent rotator cuff surgery presents to the emergency department for pain in her left shoulder blade area.  Symptoms started 2 days ago.  Patient had a follow-up with her orthopedic surgeon yesterday.  She states that she is doing very well from a rotator cuff standpoint.  She was treated for rotator cuff tear.  She was cleared to go back to light duty at work.  She has had worsening pain in the shoulder blade area which was atypical for her rotator cuff pain which is on the top of her shoulder.  She denies associated anterior chest pain, shortness of breath.  She has had some tingling in her left arm without changes in range of motion.  No associated vomiting or diaphoresis.  Patient states that she has had her husband "rubbing the cream" on the area.       Home Medications Prior to Admission medications   Medication Sig Start Date End Date Taking? Authorizing Provider  Albuterol Sulfate 108 (90 Base) MCG/ACT AEPB Inhale into the lungs. 04/17/17   [provider]  famotidine (PEPCID) 20 MG tablet Take 1 tablet (20 mg total) by mouth 2 (two) times daily. 09/22/18   Palumbo, April, MD  ibuprofen (ADVIL,MOTRIN) 800 MG tablet Take 1 tablet (800 mg total) by mouth every 8 (eight) hours as needed for up to 30 doses. 02/10/18   Curatolo, Adam, DO  Naftifine HCl 2 % CREA Apply twice a day 07/31/16   Kirt Boys, DO  predniSONE (DELTASONE) 20 MG tablet 3 tabs po day one, then 2 po daily x 4 days 09/22/18   Palumbo, April, MD  traZODone (DESYREL) 50 MG tablet Take 2 tablets (100 mg total) by mouth at bedtime. 01/05/18   Sharon Seller, NP  valsartan-hydrochlorothiazide (DIOVAN-HCT) 80-12.5 MG tablet Take 1 tablet by mouth  daily. 06/08/17   Kirt Boys, DO      Allergies    Estradiol    Review of Systems   Review of Systems  Physical Exam Updated Vital Signs BP (!) 156/84 (BP Location: Right Arm)   Pulse 67   Temp 98 F (36.7 C) (Oral)   Resp 16   Ht 5\' 3"  (1.6 m)   Wt 90.7 kg   SpO2 100%   BMI 35.43 kg/m  Physical Exam Vitals and nursing note reviewed.  Constitutional:      Appearance: She is well-developed. She is not diaphoretic.  HENT:     Head: Normocephalic and atraumatic.     Mouth/Throat:     Mouth: Mucous membranes are not dry.  Eyes:     Conjunctiva/sclera: Conjunctivae normal.  Neck:     Vascular: Normal carotid pulses. No JVD.     Trachea: Trachea normal. No tracheal deviation.  Cardiovascular:     Rate and Rhythm: Normal rate and regular rhythm.     Pulses: No decreased pulses.          Radial pulses are 2+ on the right side and 2+ on the left side.     Heart sounds: Normal heart sounds, S1 normal and S2 normal. No murmur heard. Pulmonary:     Effort: Pulmonary effort is normal. No respiratory distress.  Breath sounds: No wheezing.  Chest:     Chest wall: No tenderness.  Abdominal:     General: Bowel sounds are normal.     Palpations: Abdomen is soft.     Tenderness: There is no abdominal tenderness. There is no guarding or rebound.  Musculoskeletal:        General: Normal range of motion.     Cervical back: Normal range of motion and neck supple. No muscular tenderness.     Comments: Patient is able to raise both arms and range her shoulders well.  Skin:    General: Skin is warm and dry.     Coloration: Skin is not pale.     Findings: Rash present.     Comments: Patient with a an erythematous, early vesicular rash noted in a dermal tunnel pattern in the area of pain overlying the left scapular area.  It does not cross midline.  No active weeping.  Area is not tender to palpation.  Neurological:     Mental Status: She is alert.     ED Results / Procedures  / Treatments   Labs (all labs ordered are listed, but only abnormal results are displayed) Labs Reviewed - No data to display  EKG EKG Interpretation  Date/Time:  Saturday June 06 2022 08:58:24 EDT Ventricular Rate:  65 PR Interval:  185 QRS Duration: 92 QT Interval:  419 QTC Calculation: 436 R Axis:   58 Text Interpretation: Sinus rhythm No significant change since last tracing Confirmed by Benjiman Core [phone]) on 06/06/2022 9:14:54 AM  Radiology No results found.  Procedures Procedures    Medications Ordered in ED Medications  valACYclovir (VALTREX) tablet 1,000 mg (has no administration in time range)    ED Course/ Medical Decision Making/ A&P    Patient seen and examined. History obtained directly from patient. Work-up including labs, imaging, EKG ordered in triage, if performed, were reviewed.    Labs/EKG: Independently reviewed and interpreted.  This included: EKG without any concerning ischemic findings.  Imaging: None ordered  Medications/Fluids: Ordered: Valtrex 1 g oral  Most recent vital signs reviewed and are as follows: BP (!) 156/84 (BP Location: Right Arm)   Pulse 67   Temp 98 F (36.7 C) (Oral)   Resp 16   Ht  (1.6 m)   Wt 90.7 kg   SpO2 100%   BMI 35.43 kg/m   Initial impression: Patient with left scapular pain, new vesicular rash consistent with shingles.  Patient will be initiated on Valtrex.  She will be given a prescription for pain medicine use over the next day or 2 as she is in moderate discomfort.  Low concern for ACS, PE, pneumothorax at this time.                              Medical Decision Making Risk Prescription drug management.   Patient with localized left-sided thoracic back pain, now with shingles rash.  I suspect that this is the underlying cause of her pain.  No other concerning weakness or vascular deficits.  Seems to be doing very well from a rotator cuff standpoint I think that this is unrelated.  No  focal abdominal pain or chest pain.  No concern for dissection, PE, ACS.  The patient's vital signs, pertinent lab work and imaging were reviewed and interpreted as discussed in the ED course. Hospitalization was considered for further testing, treatments, or serial exams/observation. However as  patient is well-appearing, has a stable exam, and reassuring studies today, I do not feel that they warrant admission at this time. This plan was discussed with the patient who verbalizes agreement and comfort with this plan and seems reliable and able to return to the Emergency Department with worsening or changing symptoms.          Final Clinical Impression(s) / ED Diagnoses Final diagnoses:  Herpes zoster without complication  Acute left-sided thoracic back pain    Rx / DC Orders ED Discharge Orders          Ordered    valACYclovir (VALTREX) 1000 MG tablet  3 times daily        06/06/22 1003    HYDROcodone-acetaminophen (NORCO/VICODIN) 5-325 MG tablet  Every 6 hours PRN        06/06/22 1003              Renne Crigler, PA-C 06/06/22 1004    Benjiman Core, MD 06/06/22 1554

## 2022-06-06 NOTE — Discharge Instructions (Signed)
Please read and follow all provided instructions.  Your diagnoses today include:  1. Herpes zoster without complication   2. Acute left-sided thoracic back pain     Tests performed today include: EKG without any concerning findings Vital signs. See below for your results today.   Medications prescribed:  Valtrex: Antiviral medication for shingles  Vicodin (hydrocodone/acetaminophen) - narcotic pain medication  DO NOT drive or perform any activities that require you to be awake and alert because this medicine can make you drowsy. BE VERY CAREFUL not to take multiple medicines containing Tylenol (also called acetaminophen). Doing so can lead to an overdose which can damage your liver and cause liver failure and possibly death.  Take any prescribed medications only as directed.  Home care instructions:  Follow any educational materials contained in this packet.  BE VERY CAREFUL not to take multiple medicines containing Tylenol (also called acetaminophen). Doing so can lead to an overdose which can damage your liver and cause liver failure and possibly death.   Follow-up instructions: Please follow-up with your primary care provider in the next 7 days for further evaluation of your symptoms.   Return instructions:  Please return to the Emergency Department if you experience worsening symptoms.  Please return if you have any other emergent concerns.  Additional Information:  Your vital signs today were: BP (!) 156/84 (BP Location: Right Arm)   Pulse 67   Temp 98 F (36.7 C) (Oral)   Resp 16   Ht  (1.6 m)   Wt 90.7 kg   SpO2 100%   BMI 35.43 kg/m  If your blood pressure (BP) was elevated above 135/85 this visit, please have this repeated by your doctor within one month. --------------

## 2023-07-04 ENCOUNTER — Emergency Department (HOSPITAL_BASED_OUTPATIENT_CLINIC_OR_DEPARTMENT_OTHER)
Admission: EM | Admit: 2023-07-04 | Discharge: 2023-07-04 | Disposition: A | Discharge status: Home / Self Care | Attending: Emergency Medicine | Admitting: Emergency Medicine

## 2023-07-04 ENCOUNTER — Emergency Department (HOSPITAL_BASED_OUTPATIENT_CLINIC_OR_DEPARTMENT_OTHER)

## 2023-07-04 ENCOUNTER — Encounter (HOSPITAL_BASED_OUTPATIENT_CLINIC_OR_DEPARTMENT_OTHER): Payer: Self-pay

## 2023-07-04 ENCOUNTER — Other Ambulatory Visit: Payer: Self-pay

## 2023-07-04 DIAGNOSIS — M25511 Pain in right shoulder: Secondary | ICD-10-CM | POA: Diagnosis not present

## 2023-07-04 DIAGNOSIS — Z79899 Other long term (current) drug therapy: Secondary | ICD-10-CM | POA: Diagnosis not present

## 2023-07-04 DIAGNOSIS — I10 Essential (primary) hypertension: Secondary | ICD-10-CM | POA: Insufficient documentation

## 2023-07-04 DIAGNOSIS — R0781 Pleurodynia: Secondary | ICD-10-CM | POA: Diagnosis not present

## 2023-07-04 DIAGNOSIS — R079 Chest pain, unspecified: Secondary | ICD-10-CM

## 2023-07-04 DIAGNOSIS — R0789 Other chest pain: Secondary | ICD-10-CM | POA: Diagnosis present

## 2023-07-04 LAB — CBC WITH DIFFERENTIAL/PLATELET
Abs Immature Granulocytes: 0.01 10*3/uL (ref 0.00–0.07)
Basophils Absolute: 0 10*3/uL (ref 0.0–0.1)
Basophils Relative: 1 %
Eosinophils Absolute: 0.1 10*3/uL (ref 0.0–0.5)
Eosinophils Relative: 1 %
HCT: 40.1 % (ref 36.0–46.0)
Hemoglobin: 13.6 g/dL (ref 12.0–15.0)
Immature Granulocytes: 0 %
Lymphocytes Relative: 34 %
Lymphs Abs: 1.9 10*3/uL (ref 0.7–4.0)
MCH: 27.4 pg (ref 26.0–34.0)
MCHC: 33.9 g/dL (ref 30.0–36.0)
MCV: 80.7 fL (ref 80.0–100.0)
Monocytes Absolute: 0.4 10*3/uL (ref 0.1–1.0)
Monocytes Relative: 8 %
Neutro Abs: 3.2 10*3/uL (ref 1.7–7.7)
Neutrophils Relative %: 56 %
Platelets: 237 10*3/uL (ref 150–400)
RBC: 4.97 MIL/uL (ref 3.87–5.11)
RDW: 13.7 % (ref 11.5–15.5)
WBC: 5.6 10*3/uL (ref 4.0–10.5)
nRBC: 0 % (ref 0.0–0.2)

## 2023-07-04 LAB — TROPONIN T, HIGH SENSITIVITY: Troponin T High Sensitivity: <15 ng/L (ref <19)

## 2023-07-04 LAB — COMPREHENSIVE METABOLIC PANEL WITH GFR
ALT: 14 U/L (ref 0–44)
AST: 20 U/L (ref 15–41)
Albumin: 4.3 g/dL (ref 3.5–5.0)
Alkaline Phosphatase: 145 U/L — ABNORMAL HIGH (ref 38–126)
Anion gap: 13 (ref 5–15)
BUN: 15 mg/dL (ref 8–23)
CO2: 23 mmol/L (ref 22–32)
Calcium: 9.3 mg/dL (ref 8.9–10.3)
Chloride: 108 mmol/L (ref 98–111)
Creatinine, Ser: 0.78 mg/dL (ref 0.44–1.00)
GFR, Estimated: >60 mL/min (ref >60)
Glucose, Bld: 80 mg/dL (ref 70–99)
Potassium: 3.7 mmol/L (ref 3.5–5.1)
Sodium: 143 mmol/L (ref 135–145)
Total Bilirubin: 0.4 mg/dL (ref 0.0–1.2)
Total Protein: 7.2 g/dL (ref 6.5–8.1)

## 2023-07-04 LAB — LIPASE, BLOOD: Lipase: 24 U/L (ref 11–51)

## 2023-07-04 LAB — D-DIMER, QUANTITATIVE: D-Dimer, Quant: <0.27 ug{FEU}/mL (ref 0.00–0.50)

## 2023-07-04 MED ORDER — CYCLOBENZAPRINE HCL 10 MG PO TABS: 10.0000 mg | ORAL_TABLET | Freq: Two times a day (BID) | ORAL | 0 refills | Status: AC | PRN

## 2023-07-04 MED ORDER — IBUPROFEN 800 MG PO TABS: 800.0000 mg | ORAL_TABLET | Freq: Three times a day (TID) | ORAL | 0 refills | Status: AC

## 2023-07-04 MED ORDER — KETOROLAC TROMETHAMINE 30 MG/ML IJ SOLN
30.0000 mg | Freq: Once | INTRAMUSCULAR | Status: AC
  Administered 2023-07-04: 30 mg via INTRAVENOUS
  Filled 2023-07-04: qty 1

## 2023-07-04 NOTE — ED Provider Notes (Signed)
 [place] [facility] AT [facility] Provider Note   CSN: [id number] Arrival date & time: 07/04/23  0732     History  Chief Complaint  Patient presents with   Shoulder Injury    Angela Frost is a 63 y.o. female.  HPI     64 year old female with a history of prior left shoulder arthroscopic rotator cuff tear repair, hypertension, chronic idiopathic urticaria/angioedema, concern for DRESS in the setting of sulfasalazine (2021) who presents with concern for right shoulder pain.  Reports that the shoulder pain began on Thursday.  At first she thought it was gas pain and tried some over-the-counter remedies without relief.  The pain has been constant since Thursday and worsening.  It is worse with deep breaths and located in her right shoulder, right side of her chest and right back.  She has not had any associated shortness of breath, nausea, vomiting, abdominal pain, urinary symptoms.  The pain is not worse with movements of her shoulder and does not feel like her prior rotator cuff disease.  She has not had fever, congestion, but does note she has had a cough that also began on Thursday.  Past Medical History:  Diagnosis Date   Depression    Hot flashes    Hypertension    Overweight    Thyroid disease     Home Medications Prior to Admission medications   Medication Sig Start Date End Date Taking? Authorizing Provider  cyclobenzaprine  (FLEXERIL ) 10 MG tablet Take 1 tablet (10 mg total) by mouth 2 (two) times daily as needed for muscle spasms. 07/04/23  Yes Scarlette Currier, MD  ibuprofen  (ADVIL ) 800 MG tablet Take 1 tablet (800 mg total) by mouth 3 (three) times daily. WITH FOOD 07/04/23  Yes Scarlette Currier, MD  Albuterol  Sulfate 108 (90 Base) MCG/ACT AEPB Inhale into the lungs. 04/17/17   [provider]  famotidine  (PEPCID ) 20 MG tablet Take 1 tablet (20 mg total) by mouth 2 (two) times daily. 09/22/18   Palumbo, April, MD  HYDROcodone -acetaminophen   (NORCO/VICODIN) 5-325 MG tablet Take 1 tablet by mouth every 6 (six) hours as needed for severe pain. 06/06/22   Lyna Sandhoff, PA-C  Naftifine  HCl 2 % CREA Apply twice a day 07/31/16   Laird Pih, DO  predniSONE  (DELTASONE ) 20 MG tablet 3 tabs po day one, then 2 po daily x 4 days 09/22/18   Maralee Senate, April, MD  traZODone  (DESYREL ) 50 MG tablet Take 2 tablets (100 mg total) by mouth at bedtime. 01/05/18   Verma Gobble, NP  valACYclovir  (VALTREX ) 1000 MG tablet Take 1 tablet (1,000 mg total) by mouth 3 (three) times daily. 06/06/22   Geiple, Joshua, PA-C  valsartan -hydrochlorothiazide  (DIOVAN -HCT) 80-12.5 MG tablet Take 1 tablet by mouth daily. 06/08/17   Laird Pih, DO      Allergies    Sulfa antibiotics and Estradiol    Review of Systems   Review of Systems  Physical Exam Updated Vital Signs BP (!) 151/81   Pulse 79   Temp 97.8 F (36.6 C) (Oral)   Resp 17   Ht 5\' 3"  (1.6 m)   Wt 79.4 kg   SpO2 100%   BMI 31.00 kg/m  Physical Exam Vitals and nursing note reviewed.  Constitutional:      General: She is not in acute distress.    Appearance: She is well-developed. She is not diaphoretic.  HENT:     Head: Normocephalic and atraumatic.  Eyes:     Conjunctiva/sclera: Conjunctivae  normal.  Cardiovascular:     Rate and Rhythm: Normal rate and regular rhythm.     Heart sounds: Normal heart sounds. No murmur heard.    No friction rub. No gallop.  Pulmonary:     Effort: Pulmonary effort is normal. No respiratory distress.     Breath sounds: Normal breath sounds. No wheezing or rales.  Chest:     Chest wall: Tenderness present.  Abdominal:     General: There is no distension.     Palpations: Abdomen is soft.     Tenderness: There is no abdominal tenderness. There is no guarding.  Musculoskeletal:        General: No tenderness.     Cervical back: Normal range of motion.  Skin:    General: Skin is warm and dry.     Findings: No erythema or rash.  Neurological:      Mental Status: She is alert and oriented to person, place, and time.     ED Results / Procedures / Treatments   Labs (all labs ordered are listed, but only abnormal results are displayed) Labs Reviewed  COMPREHENSIVE METABOLIC PANEL WITH GFR - Abnormal; Notable for the following components:      Result Value   Alkaline Phosphatase 145 (*)    All other components within normal limits  CBC WITH DIFFERENTIAL/PLATELET  LIPASE, BLOOD  D-DIMER, QUANTITATIVE  TROPONIN T, HIGH SENSITIVITY    EKG EKG Interpretation Date/Time:  Sunday Jul 04 2023 07:46:30 EDT Ventricular Rate:  69 PR Interval:  204 QRS Duration:  89 QT Interval:  380 QTC Calculation: 408 R Axis:   44  Text Interpretation: Sinus rhythm Borderline T abnormalities, anterior leads No significant change since last tracing Confirmed by Jay Kempe ([id number]) on 07/04/2023 7:49:51 AM  Radiology DG Chest 2 View Result Date: 07/04/2023 CLINICAL DATA:  Right-sided chest pain. EXAM: CHEST - 2 VIEW COMPARISON:  12/29/2018 FINDINGS: The lungs are clear without focal pneumonia, edema, pneumothorax or pleural effusion. The cardiopericardial silhouette is within normal limits for size. No acute bony abnormality. Telemetry leads overlie the chest. IMPRESSION: No active cardiopulmonary disease. Electronically Signed   By: Eric  Mansell M.D.   On: 07/04/2023 08:51    Procedures Procedures    Medications Ordered in ED Medications  ketorolac (TORADOL) 30 MG/ML injection 30 mg (30 mg Intravenous Given 07/04/23 0821)    ED Course/ Medical Decision Making/ A&P                                  62  year old female with a history of prior left shoulder arthroscopic rotator cuff tear repair, hypertension, chronic idiopathic urticaria/angioedema, concern for DRESS (2021) in the setting of sulfasalazine who presents with concern for right shoulder pain for 4 days.  Differential diagnosis includes ACS, aortic dissection, pulmonary embolus,  pericarditis, cholecystitis, hepatitis, pleural effusion, pneumonia, musculoskeletal shoulder pain.  She has normal upper and lower extremity pulses, lower suspicion for aortic dissection or acute arterial thrombus.  No pain with range of motion, low suspicion for septic arthritis.  She does not have abdominal pain to suggest cholecystitis.  EKG completed and personally evaluated interpreted by me shows a normal sinus rhythm with nonspecific T wave changes.  Labs are completed and personally evaluated interpreted by me show no anemia, no leukocytosis, no transaminitis, no pancreatitis, negative troponin and have low suspicion for ACS given duration of symptoms, normal troponin and atypical features.  Low risk Wells with negative D-dimer and have low suspicion for pulmonary embolus.  Chest x-ray was completed and personally evaluated interpreted by me shows no evidence of pneumonia, effusion, or pneumothorax.  Discussed at this time do not see signs of acute emergency that would require hospitalization or surgery, however recommend continued follow-up with PCP.  On the list of possible etiologies of pain include viral etiology with cough, pleurisy, muscular strain with tenderness on exam.  Given prescription for Flexeril , ibuprofen  to take with food.  Discussed reasons to return to the emergency department in detail. Patient discharged in stable condition with understanding of reasons to return.          Final Clinical Impression(s) / ED Diagnoses Final diagnoses:  Right-sided chest pain  Pleuritic pain    Rx / DC Orders ED Discharge Orders          Ordered    cyclobenzaprine  (FLEXERIL ) 10 MG tablet  2 times daily PRN        07/04/23 0925    ibuprofen  (ADVIL ) 800 MG tablet  3 times daily        07/04/23 1610              Scarlette Currier, MD 07/04/23 [phone]

## 2023-07-04 NOTE — ED Triage Notes (Signed)
 Reports pain in R shoulder since Thursday. Pain worse w movement. No known injury.   Denies SHOB, NV, back pain
